# Patient Record
Sex: Female | Born: 1985 | Race: Black or African American | Hispanic: No | Marital: Single | Smoking: Current every day smoker
Health system: Southern US, Community
[De-identification: ages and names within clinical notes are randomized; demographics above are authoritative.]

## PROBLEM LIST (undated history)

## (undated) DIAGNOSIS — I10 Essential (primary) hypertension: Secondary | ICD-10-CM

## (undated) DIAGNOSIS — E7800 Pure hypercholesterolemia, unspecified: Secondary | ICD-10-CM

## (undated) HISTORY — PX: SMALL INTESTINE SURGERY: SHX150

---

## 2007-05-18 ENCOUNTER — Inpatient Hospital Stay (HOSPITAL_COMMUNITY): Admission: AD | Admit: 2007-05-18 | Discharge: 2007-05-18 | Payer: Self-pay | Admitting: Obstetrics and Gynecology

## 2007-05-28 ENCOUNTER — Inpatient Hospital Stay (HOSPITAL_COMMUNITY): Admission: AD | Admit: 2007-05-28 | Discharge: 2007-05-28 | Payer: Self-pay | Admitting: Obstetrics and Gynecology

## 2007-06-20 ENCOUNTER — Inpatient Hospital Stay (HOSPITAL_COMMUNITY): Admission: AD | Admit: 2007-06-20 | Discharge: 2007-06-20 | Payer: Self-pay | Admitting: Obstetrics and Gynecology

## 2007-07-06 ENCOUNTER — Emergency Department (HOSPITAL_COMMUNITY): Admission: EM | Admit: 2007-07-06 | Discharge: 2007-07-06 | Payer: Self-pay | Admitting: Emergency Medicine

## 2007-08-11 ENCOUNTER — Inpatient Hospital Stay (HOSPITAL_COMMUNITY): Admission: RE | Admit: 2007-08-11 | Discharge: 2007-08-13 | Payer: Self-pay | Admitting: Obstetrics and Gynecology

## 2008-01-02 ENCOUNTER — Emergency Department (HOSPITAL_COMMUNITY): Admission: EM | Admit: 2008-01-02 | Discharge: 2008-01-02 | Payer: Self-pay | Admitting: Family Medicine

## 2008-01-04 ENCOUNTER — Emergency Department (HOSPITAL_COMMUNITY): Admission: EM | Admit: 2008-01-04 | Discharge: 2008-01-04 | Payer: Self-pay | Admitting: Emergency Medicine

## 2010-08-04 NOTE — Discharge Summary (Signed)
Sarah Grant, Sarah Grant               ACCOUNT NO.:  [id number]   MEDICAL RECORD NO.:  [id number]          PATIENT TYPE:  INP   LOCATION:  9105                          FACILITY:  [facility]   PHYSICIAN:  Zenaida Niece, M.D.DATE OF BIRTH:  1985/11/11   DATE OF ADMISSION:  08/11/2007  DATE OF DISCHARGE:  08/13/2007                               DISCHARGE SUMMARY   ADMISSION DIAGNOSES:  Intrauterine pregnancy at 40 weeks and possible Rh  sensitization.   PROCEDURES:  On Aug 11, 2007, she had a spontaneous vaginal delivery.   HISTORY AND PHYSICAL:  This is a 25 year old black female gravida 2,  para 0-0-1-0 with an EGA of [redacted] weeks, who presents for elective  induction.  Prenatal care complicated by possible Rh sensitization.  She  had a low titer that reverted to negative by the late third trimester.  She is also size less than dates with SGA, but not growth restriction by  ultrasound.  Prenatal labs blood type is B negative with a possible low-  titer anti-D, RPR nonreactive, rubella immune, hepatitis B surface  antigen negative, HIV negative, and gonorrhea and chlamydia negative,  coag screen is normal.  Group B strep is negative.   PAST OB HISTORY:  One elective termination.   GYN HISTORY:  History of colposcopy in 2007.   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   PHYSICAL EXAMINATION:  She is afebrile with stable vital signs.  Fetal  heart tracing is reactive.  Abdomen gravid, nontender with an estimated  fetal weight of 6 pounds and she has a large transverse scar in the  upper abdomen.  Cervix is 2-3, 90, -1 vertex presentation, adequate  pelvis, and membranes are ruptured revealing clear fluid.   HOSPITAL COURSE:  The patient was admitted and started on Pitocin.  I  was then able to rupture membranes.  She progressed into labor and  received an epidural.  She progressed to complete, pushed well, and on  the evening of Aug 11, 2007, had a  vaginal delivery of a viable female  infant with Apgars of 8 and 9, weighted 6 pounds 14 ounces.  Placenta  delivered spontaneously was intact and was sent for cord blood  collection.  She had a right labial laceration repaired with 3-0 Vicryl.  Estimated blood loss was less than 500 mL.  Postpartum, she had no  significant complications.  Pre delivery hemoglobin was 11, postdelivery  is 10.5.  The baby was Rh negative, so she did not were require a workup  for RhoGAM.  On postpartum #2, she was felt to be stable enough for  discharge home.   DISCHARGE INSTRUCTIONS:  Regular diet, pelvic rest.  Follow-up is in 6  weeks.  Medications are Percocet #20 1-2 p.o. q.4-6 h. p.r.n. pain and  over-the-counter ibuprofen as needed and she is given our discharge  pamphlet.     Zenaida Niece, M.D.  Electronically Signed    TDM/MEDQ  D:  08/13/2007  T:  08/13/2007  Job:  [id number]

## 2010-12-11 LAB — RH IMMUNE GLOBULIN WORKUP (NOT WOMEN'S HOSP): DAT, IgG: NEGATIVE

## 2010-12-11 LAB — GLUCOSE TOLERANCE, 1 HOUR: Glucose, 1 Hour GTT: 81

## 2010-12-14 LAB — COMPREHENSIVE METABOLIC PANEL
ALT: 13
AST: 21
Albumin: 2.7 — ABNORMAL LOW
Alkaline Phosphatase: 70
Chloride: 103
GFR calc Af Amer: >60
Potassium: 3.7
Sodium: 133 — ABNORMAL LOW
Total Bilirubin: 0.6

## 2010-12-14 LAB — URINALYSIS, ROUTINE W REFLEX MICROSCOPIC
Bilirubin Urine: NEGATIVE
Hgb urine dipstick: NEGATIVE
Protein, ur: NEGATIVE
Urobilinogen, UA: 0.2

## 2010-12-14 LAB — FETAL FIBRONECTIN: Fetal Fibronectin: NEGATIVE

## 2010-12-14 LAB — CBC
HCT: 33.3 — ABNORMAL LOW
Platelets: 299
WBC: 15.6 — ABNORMAL HIGH

## 2010-12-16 LAB — CBC
Hemoglobin: 10.5 — ABNORMAL LOW
Hemoglobin: 11 — ABNORMAL LOW
MCHC: 34
MCHC: 34.7
MCV: 93.1
RBC: 3.49 — ABNORMAL LOW
RDW: 13.7
WBC: 17 — ABNORMAL HIGH
WBC: 7.2

## 2010-12-22 LAB — POCT URINALYSIS DIP (DEVICE)
Glucose, UA: NEGATIVE
Ketones, ur: NEGATIVE
Operator id: 29721
Protein, ur: >=300 — AB
Urobilinogen, UA: 0.2

## 2010-12-22 LAB — POCT PREGNANCY, URINE: Preg Test, Ur: NEGATIVE

## 2010-12-22 LAB — URINE CULTURE: Colony Count: 8000

## 2010-12-22 LAB — URINE MICROSCOPIC-ADD ON

## 2010-12-22 LAB — URINALYSIS, ROUTINE W REFLEX MICROSCOPIC
Glucose, UA: NEGATIVE
Hgb urine dipstick: NEGATIVE
Ketones, ur: 15 — AB
Nitrite: NEGATIVE
Protein, ur: 100 — AB
Specific Gravity, Urine: 1.036 — ABNORMAL HIGH
Urobilinogen, UA: 1
pH: 6.5

## 2010-12-26 ENCOUNTER — Emergency Department (HOSPITAL_COMMUNITY)
Admission: EM | Admit: 2010-12-26 | Discharge: 2010-12-27 | Disposition: A | Discharge status: Home / Self Care | Payer: BC Managed Care – PPO | Attending: Emergency Medicine | Admitting: Emergency Medicine

## 2010-12-26 DIAGNOSIS — J029 Acute pharyngitis, unspecified: Secondary | ICD-10-CM | POA: Insufficient documentation

## 2010-12-27 LAB — RAPID STREP SCREEN (MED CTR MEBANE ONLY): Streptococcus, Group A Screen (Direct): NEGATIVE

## 2011-10-11 ENCOUNTER — Ambulatory Visit (INDEPENDENT_AMBULATORY_CARE_PROVIDER_SITE_OTHER): Payer: BC Managed Care – PPO | Admitting: Family Medicine

## 2011-10-11 VITALS — BP 116/68 | HR 88 | Temp 97.9°F | Resp 20 | Ht 66.5 in | Wt 149.0 lb

## 2011-10-11 DIAGNOSIS — J029 Acute pharyngitis, unspecified: Secondary | ICD-10-CM

## 2011-10-11 DIAGNOSIS — N946 Dysmenorrhea, unspecified: Secondary | ICD-10-CM

## 2011-10-11 DIAGNOSIS — N912 Amenorrhea, unspecified: Secondary | ICD-10-CM

## 2011-10-11 DIAGNOSIS — J039 Acute tonsillitis, unspecified: Secondary | ICD-10-CM

## 2011-10-11 LAB — POCT CBC
Granulocyte percent: 83.2 %G — AB (ref 37–80)
HCT, POC: 43.4 % (ref 37.7–47.9)
Hemoglobin: 13.5 g/dL (ref 12.2–16.2)
POC Granulocyte: 11.4 — AB (ref 2–6.9)
RBC: 4.37 M/uL (ref 4.04–5.48)
RDW, POC: 14.1 %

## 2011-10-11 LAB — POCT URINE PREGNANCY: Preg Test, Ur: NEGATIVE

## 2011-10-11 MED ORDER — CEFTRIAXONE SODIUM 1 G IJ SOLR
1.0000 g | INTRAMUSCULAR | Status: DC
  Administered 2011-10-11: 1 g via INTRAMUSCULAR

## 2011-10-11 MED ORDER — AZITHROMYCIN 250 MG PO TABS: ORAL_TABLET | ORAL | Status: AC

## 2011-10-11 NOTE — Patient Instructions (Signed)
Return if at all worse.  Pharyngitis, Viral and Bacterial Pharyngitis is soreness (inflammation) or infection of the pharynx. It is also called a sore throat. CAUSES  Most sore throats are caused by viruses and are part of a cold. However, some sore throats are caused by strep and other bacteria. Sore throats can also be caused by post nasal drip from draining sinuses, allergies and sometimes from sleeping with an open mouth. Infectious sore throats can be spread from person to person by coughing, sneezing and sharing cups or eating utensils. TREATMENT  Sore throats that are viral usually last 3-4 days. Viral illness will get better without medications (antibiotics). Strep throat and other bacterial infections will usually begin to get better about 24-48 hours after you begin to take antibiotics. HOME CARE INSTRUCTIONS   If the caregiver feels there is a bacterial infection or if there is a positive strep test, they will prescribe an antibiotic. The full course of antibiotics must be taken. If the full course of antibiotic is not taken, you or your child may become ill again. If you or your child has strep throat and do not finish all of the medication, serious heart or kidney diseases may develop.   Drink enough water and fluids to keep your urine clear or pale yellow.   Only take over-the-counter or prescription medicines for pain, discomfort or fever as directed by your caregiver.   Get lots of rest.   Gargle with salt water ( tsp. of salt in a glass of water) as often as every 1-2 hours as you need for comfort.   Hard candies may soothe the throat if individual is not at risk for choking. Throat sprays or lozenges may also be used.  SEEK MEDICAL CARE IF:   Large, tender lumps in the neck develop.   A rash develops.   Green, yellow-brown or bloody sputum is coughed up.   Your baby is older than 3 months with a rectal temperature of 100.5 F (38.1 C) or higher for more than 1 day.    SEEK IMMEDIATE MEDICAL CARE IF:   A stiff neck develops.   You or your child are drooling or unable to swallow liquids.   You or your child are vomiting, unable to keep medications or liquids down.   You or your child has severe pain, unrelieved with recommended medications.   You or your child are having difficulty breathing (not due to stuffy nose).   You or your child are unable to fully open your mouth.   You or your child develop redness, swelling, or severe pain anywhere on the neck.   You have a fever.   Your baby is older than 3 months with a rectal temperature of 102 F (38.9 C) or higher.   Your baby is 59 months old or younger with a rectal temperature of 100.4 F (38 C) or higher.  MAKE SURE YOU:   Understand these instructions.   Will watch your condition.   Will get help right away if you are not doing well or get worse.  Document Released: 03/08/2005 Document Revised: 02/25/2011 Document Reviewed: 06/05/2007 [facility] Patient Information 2012 [place], [place].

## 2011-10-11 NOTE — Progress Notes (Signed)
Subjective: Patient has been having a sore throat since last night. She is chilled. She has a week or more late on her menstrual cycle at home brings test which is negative. She does not have running nose or cough.Has had oral sex.  Objective:  TMs normal. Throat erythematous with a lot of possible left tonsil. Neck supple without nodes on the right but has a moderate nodes submandibular on the left. Chest clear. Heart regular without murmurs.  Assessment: Pharyngitis Amenorrhea  Plan: Pregnancy test Strep screen Throat culture if strep screen positive CBC  Results for orders placed in visit on 10/11/11  POCT URINE PREGNANCY      Component Value Range   Preg Test, Ur Negative    POCT RAPID STREP A (OFFICE)      Component Value Range   Rapid Strep A Screen Negative  Negative  POCT CBC      Component Value Range   WBC 13.7 (*) 4.6 - 10.2 K/uL   Lymph, poc 1.6  0.6 - 3.4   POC LYMPH PERCENT 11.7  10 - 50 %L   MID (cbc) 0.7  0 - 0.9   POC MID % 5.1  0 - 12 %M   POC Granulocyte 11.4 (*) 2 - 6.9   Granulocyte percent 83.2 (*) 37 - 80 %G   RBC 4.37  4.04 - 5.48 M/uL   Hemoglobin 13.5  12.2 - 16.2 g/dL   HCT, POC 16.1  09.6 - 47.9 %   MCV 99.2 (*) 80 - 97 fL   MCH, POC 30.9  27 - 31.2 pg   MCHC 31.1 (*) 31.8 - 35.4 g/dL   RDW, POC 04.5     Platelet Count, POC 288  142 - 424 K/uL   MPV 9.2  0 - 99.8 fL   With the strep screen being negative cornea still treating her since her white count is high. We'll go in and give a shot of Rocephin and check a Epstein-Barr titer

## 2011-10-13 LAB — CULTURE, GROUP A STREP: Organism ID, Bacteria: NORMAL

## 2012-08-10 ENCOUNTER — Encounter (HOSPITAL_BASED_OUTPATIENT_CLINIC_OR_DEPARTMENT_OTHER): Payer: Self-pay | Admitting: Emergency Medicine

## 2012-08-10 ENCOUNTER — Emergency Department (HOSPITAL_BASED_OUTPATIENT_CLINIC_OR_DEPARTMENT_OTHER)
Admission: EM | Admit: 2012-08-10 | Discharge: 2012-08-10 | Disposition: A | Discharge status: Home / Self Care | Payer: Self-pay | Attending: Emergency Medicine | Admitting: Emergency Medicine

## 2012-08-10 DIAGNOSIS — F17200 Nicotine dependence, unspecified, uncomplicated: Secondary | ICD-10-CM | POA: Insufficient documentation

## 2012-08-10 DIAGNOSIS — H109 Unspecified conjunctivitis: Secondary | ICD-10-CM | POA: Insufficient documentation

## 2012-08-10 DIAGNOSIS — H538 Other visual disturbances: Secondary | ICD-10-CM | POA: Insufficient documentation

## 2012-08-10 DIAGNOSIS — H53149 Visual discomfort, unspecified: Secondary | ICD-10-CM | POA: Insufficient documentation

## 2012-08-10 MED ORDER — ERYTHROMYCIN 5 MG/GM OP OINT
TOPICAL_OINTMENT | Freq: Four times a day (QID) | OPHTHALMIC | Status: DC
  Administered 2012-08-10: 1 via OPHTHALMIC
  Filled 2012-08-10: qty 3.5

## 2012-08-10 MED ORDER — FLUORESCEIN SODIUM 1 MG OP STRP
1.0000 | ORAL_STRIP | Freq: Once | OPHTHALMIC | Status: AC
  Administered 2012-08-10: 1 via OPHTHALMIC

## 2012-08-10 MED ORDER — FLUORESCEIN SODIUM 1 MG OP STRP
ORAL_STRIP | OPHTHALMIC | Status: AC
  Administered 2012-08-10: 1 via OPHTHALMIC
  Filled 2012-08-10: qty 1

## 2012-08-10 MED ORDER — TETRACAINE HCL 0.5 % OP SOLN
2.0000 [drp] | Freq: Once | OPHTHALMIC | Status: AC
  Administered 2012-08-10: 2 [drp] via OPHTHALMIC

## 2012-08-10 MED ORDER — TETRACAINE HCL 0.5 % OP SOLN
OPHTHALMIC | Status: AC
  Administered 2012-08-10: 2 [drp] via OPHTHALMIC
  Filled 2012-08-10: qty 2

## 2012-08-10 MED ORDER — HYDROCODONE-ACETAMINOPHEN 5-325 MG PO TABS
1.0000 | ORAL_TABLET | Freq: Once | ORAL | Status: AC
  Administered 2012-08-10: 1 via ORAL
  Filled 2012-08-10: qty 1

## 2012-08-10 MED ORDER — HYDROCODONE-ACETAMINOPHEN 5-325 MG PO TABS: 1.0000 | ORAL_TABLET | Freq: Four times a day (QID) | ORAL | Status: DC | PRN

## 2012-08-10 NOTE — ED Notes (Signed)
Pt c/o redness, pain and light sensitivity to left eye.

## 2012-08-10 NOTE — ED Provider Notes (Signed)
History     CSN: [id number]  Arrival date & time 08/10/12  4098   First MD Initiated Contact with Patient 08/10/12 0541      Chief Complaint  Patient presents with  . Eye Pain    (Consider location/radiation/quality/duration/timing/severity/associated sxs/prior treatment) HPI This is a 27 year old female who complains of pain in her left eye since yesterday afternoon at work. The symptoms are moderate but persistent. It is associated with photophobia, redness and tearing. There is slight blurriness. She is not aware of any trauma.  History reviewed. No pertinent past medical history.  Past Surgical History  Procedure Laterality Date  . Small intestine surgery      No family history on file.  History  Substance Use Topics  . Smoking status: Current Every Day Smoker -- 0.20 packs/day    Types: Cigarettes  . Smokeless tobacco: Not on file     Comment: Black and Mild  . Alcohol Use: No    OB History   Grav Para Term Preterm Abortions TAB SAB Ect Mult Living                  Review of Systems  All other systems reviewed and are negative.    Allergies  Review of patient's allergies indicates no known allergies.  Home Medications  No current outpatient prescriptions on file.  BP 128/77  Pulse 71  Temp(Src) 98.5 F (36.9 C) (Oral)  Resp 18  SpO2 98%  Physical Exam General: Well-developed, well-nourished female in no acute distress; appearance consistent with age of record HENT: normocephalic, atraumatic Eyes: pupils equal round and reactive to light; extraocular muscles intact; left conjunctival injection, photophobia and tearing without hyphema or fluorescein uptake by the cornea; globes soft and nontender Neck: supple Heart: regular rate and rhythm Lungs: Normal respiratory effort and excursion Abdomen: soft; nondistended Extremities: No deformity; full range of motion Neurologic: Awake, alert and oriented; motor function intact in all extremities and  symmetric; no facial droop Skin: Warm and dry Psychiatric: Normal mood and affect    ED Course  Procedures (including critical care time)    MDM  Conjunctivitis versus superficial injury.        Hanley Seamen, MD 08/10/12 [phone]

## 2012-10-02 ENCOUNTER — Emergency Department (HOSPITAL_BASED_OUTPATIENT_CLINIC_OR_DEPARTMENT_OTHER)
Admission: EM | Admit: 2012-10-02 | Discharge: 2012-10-02 | Disposition: A | Discharge status: Home / Self Care | Payer: Self-pay | Attending: Emergency Medicine | Admitting: Emergency Medicine

## 2012-10-02 ENCOUNTER — Encounter (HOSPITAL_BASED_OUTPATIENT_CLINIC_OR_DEPARTMENT_OTHER): Payer: Self-pay | Admitting: Emergency Medicine

## 2012-10-02 DIAGNOSIS — Z3202 Encounter for pregnancy test, result negative: Secondary | ICD-10-CM | POA: Insufficient documentation

## 2012-10-02 DIAGNOSIS — N898 Other specified noninflammatory disorders of vagina: Secondary | ICD-10-CM | POA: Insufficient documentation

## 2012-10-02 DIAGNOSIS — F17200 Nicotine dependence, unspecified, uncomplicated: Secondary | ICD-10-CM | POA: Insufficient documentation

## 2012-10-02 DIAGNOSIS — Z202 Contact with and (suspected) exposure to infections with a predominantly sexual mode of transmission: Secondary | ICD-10-CM | POA: Insufficient documentation

## 2012-10-02 LAB — URINE MICROSCOPIC-ADD ON

## 2012-10-02 LAB — URINALYSIS, ROUTINE W REFLEX MICROSCOPIC
Glucose, UA: NEGATIVE mg/dL
Leukocytes, UA: NEGATIVE
Nitrite: NEGATIVE
Protein, ur: 100 mg/dL — AB
pH: 7 (ref 5.0–8.0)

## 2012-10-02 LAB — PREGNANCY, URINE: Preg Test, Ur: NEGATIVE

## 2012-10-02 MED ORDER — AZITHROMYCIN 250 MG PO TABS
1000.0000 mg | ORAL_TABLET | Freq: Once | ORAL | Status: AC
  Administered 2012-10-02: 1000 mg via ORAL
  Filled 2012-10-02: qty 4

## 2012-10-02 MED ORDER — CEFTRIAXONE SODIUM 250 MG IJ SOLR
250.0000 mg | Freq: Once | INTRAMUSCULAR | Status: AC
  Administered 2012-10-02: 250 mg via INTRAMUSCULAR
  Filled 2012-10-02: qty 250

## 2012-10-02 NOTE — ED Notes (Signed)
Pt states boyfriend was dx with unknown STD. Pt reports vaginal discharge.

## 2012-10-02 NOTE — ED Provider Notes (Signed)
History     This chart was scribed for Gwyneth Sprout, MD by Jiles Prows, ED Scribe. The patient was seen in room MH09/MH09 and the patient's care was started at 9:22 PM.  CSN: [id number] Arrival date & time 10/02/12  2044   Chief Complaint  Patient presents with  . Exposure to STD   Patient is a 27 y.o. female presenting with STD exposure. The history is provided by the patient and medical records. No language interpreter was used.  Exposure to STD This is a new problem. The current episode started 2 days ago. The problem occurs constantly. The problem has not changed since onset.Pertinent negatives include no abdominal pain. Nothing aggravates the symptoms. Nothing relieves the symptoms. She has tried nothing for the symptoms.   HPI Comments: Sarah Grant is a 27 y.o. female who presents to the Emergency Department complaining of vaginal discharge onset 2 days ago.  Pt reports that her boyfriend was treated for a STD with pills and a shot.  She came in for treatment for the same STD.  Pt denies headache, diaphoresis, fever, chills, nausea, vomiting, diarrhea, weakness, cough, SOB and any other pain. LMP 25th of June.  History reviewed. No pertinent past medical history. Past Surgical History  Procedure Laterality Date  . Small intestine surgery     No family history on file. History  Substance Use Topics  . Smoking status: Current Every Day Smoker -- 0.20 packs/day    Types: Cigarettes  . Smokeless tobacco: Not on file     Comment: Black and Mild  . Alcohol Use: No   OB History   Grav Para Term Preterm Abortions TAB SAB Ect Mult Living                 Review of Systems  Gastrointestinal: Negative for abdominal pain.  Genitourinary: Positive for vaginal discharge.    Allergies  Review of patient's allergies indicates no known allergies.  Home Medications   Current Outpatient Rx  Name  Route  Sig  Dispense  Refill  . HYDROcodone-acetaminophen (NORCO/VICODIN) 5-325  MG per tablet   Oral   Take 1-2 tablets by mouth every 6 (six) hours as needed for pain.   10 tablet   0    BP 119/72  Pulse 72  Temp(Src) 99.9 F (37.7 C)  Resp 18  Ht 5\' 6"  (1.676 m)  Wt 140 lb (63.504 kg)  BMI 22.61 kg/m2  SpO2 100% Physical Exam  Nursing note and vitals reviewed. Constitutional: She is oriented to person, place, and time. She appears well-developed and well-nourished. No distress.  HENT:  Head: Normocephalic and atraumatic.  Eyes: EOM are normal.  Neck: Neck supple. No tracheal deviation present.  Cardiovascular: Normal rate, regular rhythm, normal heart sounds and intact distal pulses.  Exam reveals no gallop and no friction rub.   No murmur heard. Pulmonary/Chest: Effort normal and breath sounds normal. No respiratory distress. She has no wheezes. She has no rales. She exhibits no tenderness.  Abdominal: There is no tenderness.  Genitourinary: Uterus normal. Cervix exhibits no motion tenderness and no friability. Right adnexum displays no mass, no tenderness and no fullness. Left adnexum displays no mass, no tenderness and no fullness. No tenderness around the vagina. Vaginal discharge found.  Musculoskeletal: Normal range of motion.  Neurological: She is alert and oriented to person, place, and time.  Skin: Skin is warm and dry.  Psychiatric: She has a normal mood and affect. Her behavior is normal.  ED Course  Procedures (including critical care time) DIAGNOSTIC STUDIES: Filed Vitals:   10/02/12 2054  BP: 119/72  Pulse: 72  Temp: 99.9 F (37.7 C)  Resp: 18  Height: 5\' 6"  (1.676 m)  Weight: 140 lb (63.504 kg)  SpO2: 100%   COORDINATION OF CARE: 9:25 PM - Discussed ED treatment with pt at bedside including pelvic exam and STD treatment and pt agrees.  9:26 PM - Pelvic exam, chaperone present.  Pelvic shows vaginal discharge.  No bleeding.  No cervical motion tenderness.  No adnexal tenderness.  Labs Reviewed  WET PREP, GENITAL - Abnormal;  Notable for the following:    Clue Cells Wet Prep HPF POC MODERATE (*)    WBC, Wet Prep HPF POC MODERATE (*)    All other components within normal limits  URINALYSIS, ROUTINE W REFLEX MICROSCOPIC - Abnormal; Notable for the following:    Protein, ur 100 (*)    All other components within normal limits  GC/CHLAMYDIA PROBE AMP  PREGNANCY, URINE  URINE MICROSCOPIC-ADD ON   No results found. 1. Exposure to STD     MDM   Patient with STD exposure from her boyfriend who was recently treated several days ago. The last few days she's noted thickening of vaginal discharge but otherwise has no other complaints. Well appearing on exam with normal vital signs except for mild fever of 99.9. Pelvic with vaginal discharge but no cervical motion tenderness. Wet prep showed moderate white blood cells moderate clue cells. Patient treated with Rocephin and azithromycin.  I personally performed the services described in this documentation, which was scribed in my presence.  The recorded information has been reviewed and considered.    Gwyneth Sprout, MD 10/02/12 2225

## 2012-10-03 LAB — GC/CHLAMYDIA PROBE AMP: GC Probe RNA: NEGATIVE

## 2013-07-24 ENCOUNTER — Encounter (HOSPITAL_BASED_OUTPATIENT_CLINIC_OR_DEPARTMENT_OTHER): Payer: Self-pay | Admitting: Emergency Medicine

## 2013-07-24 ENCOUNTER — Emergency Department (HOSPITAL_BASED_OUTPATIENT_CLINIC_OR_DEPARTMENT_OTHER)
Admission: EM | Admit: 2013-07-24 | Discharge: 2013-07-24 | Disposition: A | Discharge status: Home / Self Care | Payer: Managed Care, Other (non HMO) | Attending: Emergency Medicine | Admitting: Emergency Medicine

## 2013-07-24 DIAGNOSIS — R109 Unspecified abdominal pain: Secondary | ICD-10-CM | POA: Insufficient documentation

## 2013-07-24 DIAGNOSIS — Z3202 Encounter for pregnancy test, result negative: Secondary | ICD-10-CM | POA: Insufficient documentation

## 2013-07-24 DIAGNOSIS — F17200 Nicotine dependence, unspecified, uncomplicated: Secondary | ICD-10-CM | POA: Insufficient documentation

## 2013-07-24 DIAGNOSIS — R112 Nausea with vomiting, unspecified: Secondary | ICD-10-CM | POA: Insufficient documentation

## 2013-07-24 DIAGNOSIS — Z9889 Other specified postprocedural states: Secondary | ICD-10-CM | POA: Insufficient documentation

## 2013-07-24 LAB — COMPREHENSIVE METABOLIC PANEL
ALT: 36 U/L — AB (ref 0–35)
AST: 36 U/L (ref 0–37)
Albumin: 4 g/dL (ref 3.5–5.2)
Alkaline Phosphatase: 52 U/L (ref 39–117)
BILIRUBIN TOTAL: 0.6 mg/dL (ref 0.3–1.2)
BUN: 10 mg/dL (ref 6–23)
CHLORIDE: 101 meq/L (ref 96–112)
CO2: 26 meq/L (ref 19–32)
Calcium: 9.5 mg/dL (ref 8.4–10.5)
Creatinine, Ser: 0.8 mg/dL (ref 0.50–1.10)
GFR calc Af Amer: >90 mL/min (ref >90)
Glucose, Bld: 90 mg/dL (ref 70–99)
Potassium: 4.2 mEq/L (ref 3.7–5.3)
SODIUM: 139 meq/L (ref 137–147)
Total Protein: 7.8 g/dL (ref 6.0–8.3)

## 2013-07-24 LAB — CBC WITH DIFFERENTIAL/PLATELET
BASOS ABS: 0 10*3/uL (ref 0.0–0.1)
Basophils Relative: 0 % (ref 0–1)
Eosinophils Absolute: 0.1 10*3/uL (ref 0.0–0.7)
Eosinophils Relative: 1 % (ref 0–5)
HEMATOCRIT: 40.4 % (ref 36.0–46.0)
Hemoglobin: 13.9 g/dL (ref 12.0–15.0)
LYMPHS PCT: 11 % — AB (ref 12–46)
Lymphs Abs: 1 10*3/uL (ref 0.7–4.0)
MCH: 33 pg (ref 26.0–34.0)
MCHC: 34.4 g/dL (ref 30.0–36.0)
MCV: 96 fL (ref 78.0–100.0)
MONOS PCT: 5 % (ref 3–12)
Monocytes Absolute: 0.5 10*3/uL (ref 0.1–1.0)
NEUTROS ABS: 7.9 10*3/uL — AB (ref 1.7–7.7)
Neutrophils Relative %: 83 % — ABNORMAL HIGH (ref 43–77)
PLATELETS: 232 10*3/uL (ref 150–400)
RBC: 4.21 MIL/uL (ref 3.87–5.11)
RDW: 12.7 % (ref 11.5–15.5)
WBC: 9.5 10*3/uL (ref 4.0–10.5)

## 2013-07-24 LAB — URINALYSIS, ROUTINE W REFLEX MICROSCOPIC
Bilirubin Urine: NEGATIVE
GLUCOSE, UA: NEGATIVE mg/dL
HGB URINE DIPSTICK: NEGATIVE
KETONES UR: NEGATIVE mg/dL
Nitrite: NEGATIVE
PROTEIN: 30 mg/dL — AB
Specific Gravity, Urine: 1.025 (ref 1.005–1.030)
Urobilinogen, UA: 1 mg/dL (ref 0.0–1.0)
pH: 7.5 (ref 5.0–8.0)

## 2013-07-24 LAB — URINE MICROSCOPIC-ADD ON

## 2013-07-24 LAB — LIPASE, BLOOD: Lipase: 31 U/L (ref 11–59)

## 2013-07-24 LAB — PREGNANCY, URINE: PREG TEST UR: NEGATIVE

## 2013-07-24 MED ORDER — SODIUM CHLORIDE 0.9 % IV BOLUS (SEPSIS)
1000.0000 mL | Freq: Once | INTRAVENOUS | Status: AC
  Administered 2013-07-24: 1000 mL via INTRAVENOUS

## 2013-07-24 MED ORDER — ONDANSETRON HCL 4 MG/2ML IJ SOLN
4.0000 mg | Freq: Once | INTRAMUSCULAR | Status: AC
  Administered 2013-07-24: 4 mg via INTRAVENOUS
  Filled 2013-07-24: qty 2

## 2013-07-24 MED ORDER — ONDANSETRON 4 MG PO TBDP: 4.0000 mg | ORAL_TABLET | Freq: Three times a day (TID) | ORAL | Status: DC | PRN

## 2013-07-24 NOTE — ED Notes (Signed)
Pt c/o vomiting x 6 hrs

## 2013-07-24 NOTE — ED Provider Notes (Signed)
CSN: [id number]     Arrival date & time 07/24/13  [id number] History   This chart was scribed for Sarah Grant Markasia Carrol, MD by Bronson CurbJacqueline Melvin, ED Scribe. This patient was seen in room MH12/MH12 and the patient's care was started at 8:02 PM.     Chief Complaint  Patient presents with  . Emesis    The history is provided by the patient. No language interpreter was used.   HPI Comments: Sarah Grant is a 28 y.o. female who presents to the Emergency Department complaining of intermittent emesis that began 8 hours ago. Patient reports she was at work when she suddenly felt nauseous. There is associated cold sweats, nausea, and abdominal pain. She reports she has been unable to tolerate food, and has taken Pepto-Bismol with little to no relief. She denies diarrhea, difficulty breathing, chest pain, rash, or swelling. She reports history of small intestine surgery.   History reviewed. No pertinent past medical history. Past Surgical History  Procedure Laterality Date  . Small intestine surgery     History reviewed. No pertinent family history. History  Substance Use Topics  . Smoking status: Current Every Day Smoker -- 0.20 packs/day    Types: Cigarettes  . Smokeless tobacco: Not on file     Comment: Black and Mild  . Alcohol Use: No   OB History   Grav Para Term Preterm Abortions TAB SAB Ect Mult Living                 Review of Systems  Constitutional:       Per HPI, otherwise negative  HENT:       Per HPI, otherwise negative  Respiratory:       Per HPI, otherwise negative  Cardiovascular:       Per HPI, otherwise negative  Gastrointestinal: Positive for vomiting.  Endocrine:       Negative aside from HPI  Genitourinary:       Neg aside from HPI   Musculoskeletal:       Per HPI, otherwise negative  Skin: Negative.   Neurological: Negative for syncope.      Allergies  Review of patient's allergies indicates no known allergies.  Home Medications   Prior to Admission  medications   Medication Sig Start Date End Date Taking? Authorizing Provider  HYDROcodone-acetaminophen (NORCO/VICODIN) 5-325 MG per tablet Take 1-2 tablets by mouth every 6 (six) hours as needed for pain. 08/10/12   Carlisle BeersJohn L Molpus, MD   Triage Vitals: BP 124/64  Pulse 80  Temp(Src) 100 F (37.8 C) (Oral)  Resp 16  Ht 5\' 7"  (1.702 m)  Wt 140 lb (63.504 kg)  BMI 21.92 kg/m2  SpO2 100%  LMP 07/09/2013  Physical Exam  Nursing note and vitals reviewed. Constitutional: She is oriented to person, place, and time. She appears well-developed and well-nourished. No distress.  HENT:  Head: Normocephalic and atraumatic.  Eyes: Conjunctivae and EOM are normal.  Cardiovascular: Normal rate, regular rhythm and normal heart sounds.   Pulmonary/Chest: Effort normal and breath sounds normal. No stridor. No respiratory distress.  Abdominal: Soft. She exhibits no distension. There is no tenderness.  Musculoskeletal: She exhibits no edema.  Neurological: She is alert and oriented to person, place, and time. No cranial nerve deficit.  Skin: Skin is warm and dry.  Psychiatric: She has a normal mood and affect.    ED Course  Procedures (including critical care time)  DIAGNOSTIC STUDIES: Oxygen Saturation is 100% on room air, normal by  my interpretation.    COORDINATION OF CARE: At 8:07 Discussed treatment plan with patient which includes labs and IV fluids. Patient agrees.   Labs Review Labs Reviewed  CBC WITH DIFFERENTIAL - Abnormal; Notable for the following:    Neutrophils Relative % 83 (*)    Lymphocytes Relative 11 (*)    Neutro Abs 7.9 (*)    All other components within normal limits  COMPREHENSIVE METABOLIC PANEL - Abnormal; Notable for the following:    ALT 36 (*)    All other components within normal limits  URINALYSIS, ROUTINE W REFLEX MICROSCOPIC - Abnormal; Notable for the following:    Protein, ur 30 (*)    Leukocytes, UA TRACE (*)    All other components within normal limits   URINE MICROSCOPIC-ADD ON - Abnormal; Notable for the following:    Squamous Epithelial / LPF FEW (*)    All other components within normal limits  LIPASE, BLOOD  PREGNANCY, URINE    9:23 PM On repeat exam the patient appears comfortable, states that she feels entirely better, has no abdominal pain.  When we discussed current precautions, follow up instructions.   MDM    I personally performed the services described in this documentation, which was scribed in my presence. The recorded information has been reviewed and is accurate.   This generally well young female presents with several hours of nausea, vomiting. Patient has no focal abdominal pain, is in no distress, and has resolution of symptoms while here. Patient's evaluation is largely reassuring, she is discharged with antiemetics, primary care followup  Sarah Grant Adalai Perl, MD 07/24/13 2123

## 2013-07-24 NOTE — Discharge Instructions (Signed)
As discussed, your evaluation today has been largely reassuring.  But, it is important that you monitor your condition carefully, and do not hesitate to return to the ED if you develop new, or concerning changes in your condition. ° °Otherwise, please follow-up with your physician for appropriate ongoing care. ° ° °  Emergency Department Resource Guide °1) Find a Doctor and Pay Out of Pocket °Although you won't have to find out who is covered by your insurance plan, it is a good idea to ask around and get recommendations. You will then need to call the office and see if the doctor you have chosen will accept you as a new patient and what types of options they offer for patients who are self-pay. Some doctors offer discounts or will set up payment plans for their patients who do not have insurance, but you will need to ask so you aren't surprised when you get to your appointment. ° °2) Contact Your Local Health Department °Not all health departments have doctors that can see patients for sick visits, but many do, so it is worth a call to see if yours does. If you don't know where your local health department is, you can check in your phone book. The CDC also has a tool to help you locate your state's health department, and many state websites also have listings of all of their local health departments. ° °3) Find a Walk-in Clinic °If your illness is not likely to be very severe or complicated, you may want to try a walk in clinic. These are popping up all over the country in pharmacies, drugstores, and shopping centers. They're usually staffed by nurse practitioners or physician assistants that have been trained to treat common illnesses and complaints. They're usually fairly quick and inexpensive. However, if you have serious medical issues or chronic medical problems, these are probably not your best option. ° °No Primary Care Doctor: °- Call [facility] at  832-8000 - they can help you locate a primary care  doctor that  accepts your insurance, provides certain services, etc. °- Physician Referral Service- 1-800-533-3463 ° °Chronic Pain Problems: °Organization         Address  Phone   Notes  °[facility]  (336) 297-2271 Patients need to be referred by their primary care doctor.  ° °Medication Assistance: °Organization         Address  Phone   Notes  °[place] [facility] [address] °[place], [place] [address] (336) 641-8030 --Must be a resident of [place] °-- Must have NO insurance coverage whatsoever (no Medicaid/ Medicare, etc.) °-- The pt. MUST have a primary care doctor that directs their care regularly and follows them in the community °  °[facility]  (866) 331-1348   °[employer]  (888) 892-1162   ° °Agencies that provide inexpensive medical care: °Organization         Address  Phone   Notes  °[facility]  (336) 832-8035   °[facility]    (336) 832-7272   °[facility] [address] °[place], [place] [address] (336) 832-4777   °[facility] [address], °[place] (336) 271-4999   °[facility]    (336) 373-0678   °[facility]    (336) 272-1050   °[facility] ° [address],  Phone:  (336) 832-4444, Fax:  (336) 832-4440 Hours of Operation:    9 am - 6 pm, M-F.  Also accepts Medicaid/Medicare and self-pay.  °[facility] ° [address], [place] Phone: (336) 832-3150, Fax: (336) 832-3151. Hours of Operation:  8:30 am - 5:30 pm, M-F.  Also accepts Medicaid and self-pay.  °[facility] [place] [address], [place] Phone: (336) 878-6027   °[facility] [address], [place], [place] (336)723-1848, Ext. 123 Mondays & Thursdays: 7-9 AM.  First 15 patients are seen on a first come, first serve basis. °  ° °Medicaid-accepting [place]  Providers: ° °Organization         Address  Phone   Notes  °[facility] [address], [place] (336) 641-2100 Also accepts self-pay patients.  °[facility] [address], [place] ° (336) 856-9996   °[facility] [address], [place] (336) 288-8857   °[facility] [address] [place] [address], [place] (336) 299-7000   °Veita Bland [address], [place]  ° (336) 373-1557 Only accepts [place] Access Medicaid patients after they have their name applied to their card.  ° °Self-Pay (no insurance) in [place]: ° °Organization         Address  Phone   Notes  °Sickle Cell Patients, [facility] [address], [place] (336) 832-1970   °[facility] [address], [place] (336) 832-4400   °[facility] ° [address], [place] (336) 992-4800   °[facility]/Dr. Osei-Bonsu ° [address] [place] [address], [place] or [address], [place] (336) 841-8500 Phone number for both [place] and [place] locations is the same.  °[facility] [address], [place] (336) 299-0000   °[facility] [address] [place] [address], [place] or [address] (336) 852-7530 °(336) 878-2260   °[facility] [address], [place] (336) 350-1642, phone; (336) 294-5005, fax Sees patients 1st and 3rd Saturday of every month.  Must not qualify for public or private insurance (i.e. Medicaid, Medicare, [place] Health Choice, Veterans' Benefits) • Household income should be no more than 200% of the poverty level •The clinic cannot treat you if you are pregnant or think you are pregnant • Sexually transmitted diseases are not treated at the clinic.  ° °Dental Care: °Organization         Address  Phone  Notes  °[facility] [address], [place] (336) 641-6152 Accepts children up to age 21 who are enrolled in Medicaid or [place] Health Choice; pregnant women with a Medicaid card; and children who have applied for Medicaid or [place] Health Choice, but were declined, whose parents can pay a reduced fee at time of service.  °[facility] [place]  [address], [place] (336) 641-7733 Accepts children up to age 21 who are enrolled in Medicaid or [place] Health Choice; pregnant women with a Medicaid card; and children who have applied for Medicaid or [place] Health Choice, but were declined, whose parents can pay a reduced fee at time of service.  °[facility] ° [address], [place] (336) 641-4533 Patients are seen by appointment only. Walk-ins are not accepted. [facility] will see patients 18 years of   age and older. °Monday - Tuesday (8am-5pm) °Most Wednesdays (8:30-5pm) °$30 per visit, cash only  °[facility] ° [address], [place] (336) 641-4533 Patients are seen by appointment only. Walk-ins are not accepted. [facility] will see patients 18 years of age and older. °One Wednesday Evening (Monthly: Volunteer Based).  $30 per visit, cash only  °[facility]  (919) 537-3737 for adults; Children under age 4, call Graduate Pediatric Dentistry at (919) [phone]. Children aged 4-14, please call (919) [phone] to request a pediatric application. ° Dental services are provided in all areas of dental care including fillings, crowns and bridges, complete and partial dentures, implants, gum treatment, root canals, and extractions. Preventive care is also provided. Treatment is provided to both adults and children. °Patients are selected via a lottery and there is often a waiting list. °  °[facility] [address], °[place] ° (336) 763-8833 [email or web address] °  °[facility]  [address], [place], [place] (336)723-1848, Ext. 123 Second and Fourth Thursday of each month, opens at 6:30 AM; Clinic ends at 9 AM.  Patients are seen on a first-come first-served basis, and a limited number are seen during each clinic.  ° °[facility] ° [address], [place], [place] (336) 723-7904   Eligibility Requirements °You must have lived in [place], [place], or [place] counties for at least the last three months. °  You cannot be eligible for state or federal sponsored healthcare insurance, including Veterans Administration, Medicaid, or Medicare. °  You generally cannot be eligible for healthcare insurance through your employer.  °  How to apply: °Eligibility screenings are held every Tuesday and Wednesday afternoon from 1:00 pm until 4:00 pm. You do not need an appointment for the interview!  °[facility] [address], [place], [place] 336-631-2330   °[facility]  336-342-8273   °[facility]  336-703-3100   °[facility]  336-570-6415   ° °Behavioral Health Resources in the Community: °Intensive Outpatient Programs °Organization         Address  Phone  Notes  °[facility] [address], [place], [place] 336-878-6098   °[facility] [address], [place], [place] 336-832-9800   °[facility] [address], [place], [place] ° 336-882-2125   °[facility] [address],  °[place], [place] 1-800-853-5163 or 336-641-4981   °Substance Abuse Resources °Organization         Address  Phone  Notes  °[facility]  336-882-2125   °[facility]  336-784-9470   °[facility]  336-285-9073   °[facility]  336-845-3988   °Residential & Outpatient Substance Abuse Program  1-800-659-3381   °Psychological Services °Organization         Address  Phone  Notes  °[facility]  336- 832-9600    °[facility]  336- 378-7881   °[facility] [address], [place] 1-800-853-5163 or 336-641-4981   ° °Mobile Crisis Teams °Organization         Address  Phone  Notes  °[facility], Mobile Crisis Care Unit  1-877-626-1772   °[facility]ive °[facility] ° [address] [place], [place] 336-834-9664   °Sharon DeEsch [address] °[place] 336-554-5454   ° °Self-Help/Support Groups °Organization           Address  Phone             Notes  °[facility] - variety of support groups  336- 373-1402 Call for more information  °Narcotics Anonymous (NA), [facility] [address], °[place]  2 meetings at this location  ° °Residential Treatment Programs °Organization         Address  Phone  Notes  °[facility] [address],    °[place]  1-866-801-8205   °[facility] ° [address], [place], [place] 704-293-8524   °[facility] [address], [place] 336-845-3988 Admissions: 8am-3pm M-F  °[facility] [address]    °[place], [place] 336-841-1104   °[facility] [address], [place], [place] 336-379-7146   °[facility] [address]  °[place], [place] 336-285-9073   °[facility] - Intensive Outpatient [address], [place], [place] 336-852-3033   °[facility] [address]  °[place], [place] 1-877-615-2722 or 336-784-9470   °[facility] [address] [place], [place] 336-227-7417 Accepts Medicaid  °[facility] [address]  °[place] 1-800-659-3381 Substance Abuse/Addiction Treatment  ° °[place] Behavioral Health Resources °Organization         Address  Phone  Notes  °[facility]  (888) 581-9988   °Julie Brannon, PhD [address] [place], [place]   (336) 349-5553 or (336) 951-0000    °[facility]   [address] °[place], [place] (336) 349-4454   °[facility] [address], [place], [place] (336) 342-8316 Insurance/Medicaid/sponsorship through [facility]  °[facility] [address]                                    [place], [place] (336) 342-8316 Therapy/tele-psych/case  °[facility] [address]  ° [place], [place] (336) 349-2233    °Dr. Arfeen  (336) 349-4544   °[facility] [place]  [employer] [place] [facility] 1) [address], [place] °2) [address], [place] °3)  [address], [place] (336) 349-3220 °(336) 342-7768 ° °(336) 342-8140   °[place] [facility] (336) 342-1394 or (336) 342-3537 (After Hours)    ° °   ° °

## 2013-11-07 ENCOUNTER — Encounter (HOSPITAL_BASED_OUTPATIENT_CLINIC_OR_DEPARTMENT_OTHER): Payer: Self-pay | Admitting: Emergency Medicine

## 2013-11-07 ENCOUNTER — Emergency Department (HOSPITAL_BASED_OUTPATIENT_CLINIC_OR_DEPARTMENT_OTHER)
Admission: EM | Admit: 2013-11-07 | Discharge: 2013-11-07 | Disposition: A | Discharge status: Home / Self Care | Payer: Managed Care, Other (non HMO) | Attending: Emergency Medicine | Admitting: Emergency Medicine

## 2013-11-07 DIAGNOSIS — J029 Acute pharyngitis, unspecified: Secondary | ICD-10-CM | POA: Diagnosis present

## 2013-11-07 DIAGNOSIS — J020 Streptococcal pharyngitis: Secondary | ICD-10-CM | POA: Insufficient documentation

## 2013-11-07 DIAGNOSIS — F17200 Nicotine dependence, unspecified, uncomplicated: Secondary | ICD-10-CM | POA: Diagnosis not present

## 2013-11-07 LAB — RAPID STREP SCREEN (MED CTR MEBANE ONLY): STREPTOCOCCUS, GROUP A SCREEN (DIRECT): POSITIVE — AB

## 2013-11-07 MED ORDER — PENICILLIN G BENZATHINE 1200000 UNIT/2ML IM SUSP
1.2000 10*6.[IU] | Freq: Once | INTRAMUSCULAR | Status: AC
  Administered 2013-11-07: 1.2 10*6.[IU] via INTRAMUSCULAR
  Filled 2013-11-07: qty 2

## 2013-11-07 NOTE — ED Provider Notes (Signed)
CSN: [id number]     Arrival date & time 11/07/13  1526 History   First MD Initiated Contact with Patient 11/07/13 1541     Chief Complaint  Patient presents with  . Sore Throat     (Consider location/radiation/quality/duration/timing/severity/associated sxs/prior Treatment) Patient is a 28 y.o. female presenting with pharyngitis. The history is provided by the patient. No language interpreter was used.  Sore Throat This is a new problem. The current episode started today. The problem occurs constantly. The problem has been unchanged. Associated symptoms include chills and a sore throat. Pertinent negatives include no fever. The symptoms are aggravated by swallowing. She has tried nothing for the symptoms.    History reviewed. No pertinent past medical history. Past Surgical History  Procedure Laterality Date  . Small intestine surgery     No family history on file. History  Substance Use Topics  . Smoking status: Current Every Day Smoker -- 0.20 packs/day    Types: Cigarettes  . Smokeless tobacco: Not on file     Comment: Black and Mild  . Alcohol Use: No   OB History   Grav Para Term Preterm Abortions TAB SAB Ect Mult Living                 Review of Systems  Constitutional: Positive for chills. Negative for fever.  HENT: Positive for sore throat.   Respiratory: Negative.   Cardiovascular: Negative.       Allergies  Review of patient's allergies indicates no known allergies.  Home Medications   Prior to Admission medications   Medication Sig Start Date End Date Taking? Authorizing Provider  HYDROcodone-acetaminophen (NORCO/VICODIN) 5-325 MG per tablet Take 1-2 tablets by mouth every 6 (six) hours as needed for pain. 08/10/12   Carlisle BeersJohn L Molpus, MD  ondansetron (ZOFRAN ODT) 4 MG disintegrating tablet Take 1 tablet (4 mg total) by mouth every 8 (eight) hours as needed for nausea or vomiting. 07/24/13   Gerhard Munchobert Lockwood, MD   BP 132/69  Pulse 77  Temp(Src) 99.5 F (37.5  C) (Oral)  Resp 18  Ht 5\' 7"  (1.702 m)  Wt 151 lb (68.493 kg)  BMI 23.64 kg/m2  SpO2 100%  LMP 10/27/2013 Physical Exam  Nursing note and vitals reviewed. Constitutional: She is oriented to person, place, and time. She appears well-developed and well-nourished.  HENT:  Right Ear: External ear normal.  Left Ear: External ear normal.  Mouth/Throat: Posterior oropharyngeal erythema present.  Neck: Normal range of motion. Neck supple.  Cardiovascular: Normal rate and regular rhythm.   Pulmonary/Chest: Effort normal and breath sounds normal.  Neurological: She is alert and oriented to person, place, and time.  Skin: Skin is warm and dry.    ED Course  Procedures (including critical care time) Labs Review Labs Reviewed  RAPID STREP SCREEN - Abnormal; Notable for the following:    Streptococcus, Group A Screen (Direct) POSITIVE (*)    All other components within normal limits    Imaging Review No results found.   EKG Interpretation None      MDM   Final diagnoses:  Strep pharyngitis    Pt treated with bicillin here   Teressa LowerVrinda Kevia Zaucha, NP 11/07/13 1622

## 2013-11-07 NOTE — Discharge Instructions (Signed)
Follow up as needed for continued symptoms Strep Throat Tests While most sore throats are caused by viruses, at times they are caused by a bacteria called group A Streptococci (strep throat). It is important to determine the cause because the strep bacteria is treated with antibiotic medication. There are 2 types of tests for strep throat: a rapid strep test and a throat culture. Both tests are done by wiping a swab over the back of the throat and then using chemicals to identify the type of bacteria present. The rapid strep test takes 10 to 20 minutes. If the rapid strep test is negative, a throat culture may be performed to confirm the results. With a throat culture, the swab is used to spread the bacteria on a gel plate and grow it in a lab, which may take 1 to 2 days. In some cases, the culture will detect strep bacteria not found with the rapid strep test. If the result of the rapid strep test is positive, no further testing is needed, and your caregiver will prescribe antibiotics. Not all test results are available during your visit. If your test results are not back during the visit, make an appointment with your caregiver to find out the results. Do not assume everything is normal if you have not heard from your caregiver or the medical facility. It is important for you to follow up on all of your test results. SEEK MEDICAL CARE IF:   Your symptoms are not improving within 1 to 2 days, or you are getting worse.  You have any other questions or concerns. SEEK IMMEDIATE MEDICAL CARE IF:   You have increased difficulty with swallowing.  You develop trouble breathing.  You have a fever. Document Released: 04/15/2004 Document Revised: 05/31/2011 Document Reviewed: 06/13/2013 [facility] Patient Information 2015 [place], [place]. This information is not intended to replace advice given to you by your health care provider. Make sure you discuss any questions you have with your health care  provider.

## 2013-11-07 NOTE — ED Notes (Signed)
Sore throat since this morning. Reports job sent her here. Chills at work. White patches on throat.

## 2013-11-08 NOTE — ED Provider Notes (Signed)
Medical screening examination/treatment/procedure(s) were performed by non-physician practitioner and as supervising physician I was immediately available for consultation/collaboration.   Toy CookeyMegan Docherty, MD 11/08/13 1144

## 2013-12-10 ENCOUNTER — Emergency Department (HOSPITAL_BASED_OUTPATIENT_CLINIC_OR_DEPARTMENT_OTHER)
Admission: EM | Admit: 2013-12-10 | Discharge: 2013-12-10 | Disposition: A | Discharge status: Home / Self Care | Payer: 59 | Attending: Emergency Medicine | Admitting: Emergency Medicine

## 2013-12-10 ENCOUNTER — Encounter (HOSPITAL_BASED_OUTPATIENT_CLINIC_OR_DEPARTMENT_OTHER): Payer: Self-pay | Admitting: Emergency Medicine

## 2013-12-10 DIAGNOSIS — F17200 Nicotine dependence, unspecified, uncomplicated: Secondary | ICD-10-CM | POA: Insufficient documentation

## 2013-12-10 DIAGNOSIS — Z3202 Encounter for pregnancy test, result negative: Secondary | ICD-10-CM | POA: Diagnosis not present

## 2013-12-10 DIAGNOSIS — Z79899 Other long term (current) drug therapy: Secondary | ICD-10-CM | POA: Diagnosis not present

## 2013-12-10 DIAGNOSIS — R51 Headache: Secondary | ICD-10-CM | POA: Insufficient documentation

## 2013-12-10 DIAGNOSIS — R112 Nausea with vomiting, unspecified: Secondary | ICD-10-CM | POA: Insufficient documentation

## 2013-12-10 LAB — CBC WITH DIFFERENTIAL/PLATELET
BASOS ABS: 0 10*3/uL (ref 0.0–0.1)
Basophils Relative: 0 % (ref 0–1)
EOS ABS: 0.1 10*3/uL (ref 0.0–0.7)
Eosinophils Relative: 1 % (ref 0–5)
HCT: 37 % (ref 36.0–46.0)
Hemoglobin: 12.4 g/dL (ref 12.0–15.0)
LYMPHS ABS: 2 10*3/uL (ref 0.7–4.0)
Lymphocytes Relative: 37 % (ref 12–46)
MCH: 31.8 pg (ref 26.0–34.0)
MCHC: 33.5 g/dL (ref 30.0–36.0)
MCV: 94.9 fL (ref 78.0–100.0)
MONO ABS: 0.6 10*3/uL (ref 0.1–1.0)
MONOS PCT: 10 % (ref 3–12)
Neutro Abs: 2.8 10*3/uL (ref 1.7–7.7)
Neutrophils Relative %: 52 % (ref 43–77)
PLATELETS: 210 10*3/uL (ref 150–400)
RBC: 3.9 MIL/uL (ref 3.87–5.11)
RDW: 12.6 % (ref 11.5–15.5)
WBC: 5.5 10*3/uL (ref 4.0–10.5)

## 2013-12-10 LAB — COMPREHENSIVE METABOLIC PANEL
ALBUMIN: 3.4 g/dL — AB (ref 3.5–5.2)
ALT: 45 U/L — AB (ref 0–35)
AST: 40 U/L — AB (ref 0–37)
Alkaline Phosphatase: 62 U/L (ref 39–117)
Anion gap: 11 (ref 5–15)
BUN: 8 mg/dL (ref 6–23)
CALCIUM: 9.4 mg/dL (ref 8.4–10.5)
CO2: 26 meq/L (ref 19–32)
Chloride: 101 mEq/L (ref 96–112)
Creatinine, Ser: 0.8 mg/dL (ref 0.50–1.10)
GFR calc Af Amer: >90 mL/min (ref >90)
GFR calc non Af Amer: >90 mL/min (ref >90)
Glucose, Bld: 94 mg/dL (ref 70–99)
Potassium: 4 mEq/L (ref 3.7–5.3)
SODIUM: 138 meq/L (ref 137–147)
TOTAL PROTEIN: 8.1 g/dL (ref 6.0–8.3)
Total Bilirubin: 0.3 mg/dL (ref 0.3–1.2)

## 2013-12-10 LAB — URINALYSIS, ROUTINE W REFLEX MICROSCOPIC
Bilirubin Urine: NEGATIVE
Glucose, UA: NEGATIVE mg/dL
HGB URINE DIPSTICK: NEGATIVE
Ketones, ur: NEGATIVE mg/dL
Leukocytes, UA: NEGATIVE
Nitrite: NEGATIVE
PROTEIN: NEGATIVE mg/dL
Specific Gravity, Urine: 1.008 (ref 1.005–1.030)
UROBILINOGEN UA: 0.2 mg/dL (ref 0.0–1.0)
pH: 7 (ref 5.0–8.0)

## 2013-12-10 LAB — LIPASE, BLOOD: LIPASE: 25 U/L (ref 11–59)

## 2013-12-10 LAB — PREGNANCY, URINE: Preg Test, Ur: NEGATIVE

## 2013-12-10 MED ORDER — ONDANSETRON HCL 4 MG PO TABS: 4.0000 mg | ORAL_TABLET | Freq: Four times a day (QID) | ORAL | Status: DC

## 2013-12-10 NOTE — ED Notes (Signed)
Intermittent nausea and vomiting x 2 weeks.

## 2013-12-10 NOTE — ED Provider Notes (Signed)
CSN: [id number]     Arrival date & time 12/10/13  1450 History   First MD Initiated Contact with Patient 12/10/13 1613     Chief Complaint  Patient presents with  . Emesis     (Consider location/radiation/quality/duration/timing/severity/associated sxs/prior Treatment) Patient is a 28 y.o. female presenting with vomiting. The history is provided by the patient.  Emesis Severity:  Moderate Duration:  2 weeks Timing:  Intermittent Number of daily episodes:  3 x day Quality:  Undigested food and stomach contents Able to tolerate:  Liquids Progression:  Unchanged Chronicity:  New Recent urination:  Normal Relieved by:  Nothing Worsened by:  Nothing tried Ineffective treatments:  None tried Associated symptoms: headaches   Associated symptoms: no abdominal pain, no chills, no cough, no diarrhea, no fever, no myalgias, no sore throat and no URI   Risk factors: not pregnant now and no sick contacts    Sarah Grant is a 28 y.o. female who presents to the ED with nausea and vomiting off and on for the past 2 weeks. Patient denies any symptoms at this time. She was having vomiting after she ate lunch today but symptoms went completely away.   Patient requesting pregnancy test.   History reviewed. No pertinent past medical history. Past Surgical History  Procedure Laterality Date  . Small intestine surgery     No family history on file. History  Substance Use Topics  . Smoking status: Current Every Day Smoker -- 0.20 packs/day    Types: Cigarettes  . Smokeless tobacco: Not on file     Comment: Black and Mild  . Alcohol Use: No   OB History   Grav Para Term Preterm Abortions TAB SAB Ect Mult Living                 Review of Systems  Constitutional: Negative for fever, chills and unexpected weight change.  HENT: Negative for congestion, dental problem, ear pain and sore throat.   Eyes: Negative for pain, redness and visual disturbance.  Respiratory: Negative for cough and  shortness of breath.   Cardiovascular: Negative for chest pain, palpitations and leg swelling.  Gastrointestinal: Positive for nausea and vomiting. Negative for abdominal pain, diarrhea and constipation.  Genitourinary: Negative for dysuria, vaginal bleeding, vaginal discharge and pelvic pain.  Musculoskeletal: Negative for back pain and myalgias.  Skin: Negative for rash and wound.  Neurological: Positive for headaches. Negative for dizziness, syncope and light-headedness.  Psychiatric/Behavioral: Negative for confusion. The patient is not nervous/anxious.       Allergies  Review of patient's allergies indicates no known allergies.  Home Medications   Prior to Admission medications   Medication Sig Start Date End Date Taking? Authorizing Provider  HYDROcodone-acetaminophen (NORCO/VICODIN) 5-325 MG per tablet Take 1-2 tablets by mouth every 6 (six) hours as needed for pain. 08/10/12   Karen Chafe Molpus, MD  ondansetron (ZOFRAN ODT) 4 MG disintegrating tablet Take 1 tablet (4 mg total) by mouth every 8 (eight) hours as needed for nausea or vomiting. 07/24/13   Carmin Muskrat, MD   BP 123/82  Pulse 71  Temp(Src) 98.9 F (37.2 C) (Oral)  Resp 16  Ht '5\' 6"'  (1.676 m)  Wt 151 lb (68.493 kg)  BMI 24.38 kg/m2  SpO2 100%  LMP 11/24/2013 Physical Exam  Nursing note and vitals reviewed. Constitutional: She is oriented to person, place, and time. She appears well-developed and well-nourished.  HENT:  Head: Normocephalic and atraumatic.  Eyes: Conjunctivae and EOM are normal.  Neck: Normal range of motion. Neck supple.  Cardiovascular: Normal rate, regular rhythm and normal heart sounds.   Pulmonary/Chest: Effort normal.  Abdominal: Soft. Bowel sounds are normal. There is no tenderness.  Musculoskeletal: Normal range of motion.  Neurological: She is alert and oriented to person, place, and time. No cranial nerve deficit.  Skin: Skin is warm and dry.  Psychiatric: She has a normal mood and  affect. Her behavior is normal.   Results for orders placed during the hospital encounter of 12/10/13 (from the past 24 hour(s))  PREGNANCY, URINE     Status: None   Collection Time    12/10/13  3:13 PM      Result Value Ref Range   Preg Test, Ur NEGATIVE  NEGATIVE  URINALYSIS, ROUTINE W REFLEX MICROSCOPIC     Status: None   Collection Time    12/10/13  3:13 PM      Result Value Ref Range   Color, Urine YELLOW  YELLOW   APPearance CLEAR  CLEAR   Specific Gravity, Urine 1.008  1.005 - 1.030   pH 7.0  5.0 - 8.0   Glucose, UA NEGATIVE  NEGATIVE mg/dL   Hgb urine dipstick NEGATIVE  NEGATIVE   Bilirubin Urine NEGATIVE  NEGATIVE   Ketones, ur NEGATIVE  NEGATIVE mg/dL   Protein, ur NEGATIVE  NEGATIVE mg/dL   Urobilinogen, UA 0.2  0.0 - 1.0 mg/dL   Nitrite NEGATIVE  NEGATIVE   Leukocytes, UA NEGATIVE  NEGATIVE    Results for orders placed during the hospital encounter of 12/10/13 (from the past 72 hour(s))  PREGNANCY, URINE     Status: None   Collection Time    12/10/13  3:13 PM      Result Value Ref Range   Preg Test, Ur NEGATIVE  NEGATIVE   Comment:            THE SENSITIVITY OF THIS     METHODOLOGY IS >20 mIU/mL.  URINALYSIS, ROUTINE W REFLEX MICROSCOPIC     Status: None   Collection Time    12/10/13  3:13 PM      Result Value Ref Range   Color, Urine YELLOW  YELLOW   APPearance CLEAR  CLEAR   Specific Gravity, Urine 1.008  1.005 - 1.030   pH 7.0  5.0 - 8.0   Glucose, UA NEGATIVE  NEGATIVE mg/dL   Hgb urine dipstick NEGATIVE  NEGATIVE   Bilirubin Urine NEGATIVE  NEGATIVE   Ketones, ur NEGATIVE  NEGATIVE mg/dL   Protein, ur NEGATIVE  NEGATIVE mg/dL   Urobilinogen, UA 0.2  0.0 - 1.0 mg/dL   Nitrite NEGATIVE  NEGATIVE   Leukocytes, UA NEGATIVE  NEGATIVE   Comment: MICROSCOPIC NOT DONE ON URINES WITH NEGATIVE PROTEIN, BLOOD, LEUKOCYTES, NITRITE, OR GLUCOSE <1000 mg/dL.  CBC WITH DIFFERENTIAL     Status: None   Collection Time    12/10/13  5:18 PM      Result Value Ref  Range   WBC 5.5  4.0 - 10.5 K/uL   RBC 3.90  3.87 - 5.11 MIL/uL   Hemoglobin 12.4  12.0 - 15.0 g/dL   HCT 37.0  36.0 - 46.0 %   MCV 94.9  78.0 - 100.0 fL   MCH 31.8  26.0 - 34.0 pg   MCHC 33.5  30.0 - 36.0 g/dL   RDW 12.6  11.5 - 15.5 %   Platelets 210  150 - 400 K/uL   Neutrophils Relative % 52  43 -  77 %   Lymphocytes Relative 37  12 - 46 %   Monocytes Relative 10  3 - 12 %   Eosinophils Relative 1  0 - 5 %   Basophils Relative 0  0 - 1 %   Neutro Abs 2.8  1.7 - 7.7 K/uL   Lymphs Abs 2.0  0.7 - 4.0 K/uL   Monocytes Absolute 0.6  0.1 - 1.0 K/uL   Eosinophils Absolute 0.1  0.0 - 0.7 K/uL   Basophils Absolute 0.0  0.0 - 0.1 K/uL  COMPREHENSIVE METABOLIC PANEL     Status: Abnormal   Collection Time    12/10/13  5:18 PM      Result Value Ref Range   Sodium 138  137 - 147 mEq/L   Potassium 4.0  3.7 - 5.3 mEq/L   Chloride 101  96 - 112 mEq/L   CO2 26  19 - 32 mEq/L   Glucose, Bld 94  70 - 99 mg/dL   BUN 8  6 - 23 mg/dL   Creatinine, Ser 0.80  0.50 - 1.10 mg/dL   Calcium 9.4  8.4 - 10.5 mg/dL   Total Protein 8.1  6.0 - 8.3 g/dL   Albumin 3.4 (*) 3.5 - 5.2 g/dL   AST 40 (*) 0 - 37 U/L   ALT 45 (*) 0 - 35 U/L   Alkaline Phosphatase 62  39 - 117 U/L   Total Bilirubin 0.3  0.3 - 1.2 mg/dL   GFR calc non Af Amer >90  >90 mL/min   GFR calc Af Amer >90  >90 mL/min   Comment: (NOTE)     The eGFR has been calculated using the CKD EPI equation.     This calculation has not been validated in all clinical situations.     eGFR's persistently <90 mL/min signify possible Chronic Kidney     Disease.   Anion gap 11  5 - 15  LIPASE, BLOOD     Status: None   Collection Time    12/10/13  5:18 PM      Result Value Ref Range   Lipase 25  11 - 59 U/L    ED Course  Procedures  After pregnancy test back and negative the patient states she needs to leave and is not having any problems right now.  MDM  28 y.o. female with intermittent nausea and vomiting x 2 week and concern for pregnancy.  She is stable for discharge with a negative pregnancy test and is asymptomatic at this time. She will follow up with her PCP or return if symptoms return.     [facility] Bunnie Pion, [place] 12/13/13 1113

## 2013-12-10 NOTE — Discharge Instructions (Signed)

## 2013-12-11 ENCOUNTER — Ambulatory Visit (INDEPENDENT_AMBULATORY_CARE_PROVIDER_SITE_OTHER): Payer: Managed Care, Other (non HMO)

## 2013-12-11 ENCOUNTER — Ambulatory Visit (INDEPENDENT_AMBULATORY_CARE_PROVIDER_SITE_OTHER): Payer: Managed Care, Other (non HMO) | Admitting: Family Medicine

## 2013-12-11 VITALS — BP 120/80 | HR 78 | Temp 98.7°F | Resp 16 | Ht 66.0 in | Wt 152.0 lb

## 2013-12-11 DIAGNOSIS — N760 Acute vaginitis: Secondary | ICD-10-CM

## 2013-12-11 DIAGNOSIS — R112 Nausea with vomiting, unspecified: Secondary | ICD-10-CM

## 2013-12-11 DIAGNOSIS — A499 Bacterial infection, unspecified: Secondary | ICD-10-CM

## 2013-12-11 DIAGNOSIS — B370 Candidal stomatitis: Secondary | ICD-10-CM

## 2013-12-11 DIAGNOSIS — B9689 Other specified bacterial agents as the cause of diseases classified elsewhere: Secondary | ICD-10-CM

## 2013-12-11 DIAGNOSIS — Z0000 Encounter for general adult medical examination without abnormal findings: Secondary | ICD-10-CM

## 2013-12-11 LAB — COMPREHENSIVE METABOLIC PANEL
ALBUMIN: 4 g/dL (ref 3.5–5.2)
ALK PHOS: 57 U/L (ref 39–117)
ALT: 48 U/L — ABNORMAL HIGH (ref 0–35)
AST: 41 U/L — AB (ref 0–37)
BUN: 12 mg/dL (ref 6–23)
CO2: 26 mEq/L (ref 19–32)
CREATININE: 0.77 mg/dL (ref 0.50–1.10)
Calcium: 9.6 mg/dL (ref 8.4–10.5)
Chloride: 103 mEq/L (ref 96–112)
Glucose, Bld: 91 mg/dL (ref 70–99)
Potassium: 4.3 mEq/L (ref 3.5–5.3)
Sodium: 136 mEq/L (ref 135–145)
Total Bilirubin: 0.3 mg/dL (ref 0.2–1.2)
Total Protein: 8 g/dL (ref 6.0–8.3)

## 2013-12-11 LAB — POCT UA - MICROSCOPIC ONLY
BACTERIA, U MICROSCOPIC: NEGATIVE
CASTS, UR, LPF, POC: NEGATIVE
Crystals, Ur, HPF, POC: NEGATIVE
MUCUS UA: NEGATIVE
RBC, urine, microscopic: NEGATIVE
WBC, Ur, HPF, POC: NEGATIVE
YEAST UA: NEGATIVE

## 2013-12-11 LAB — POCT WET PREP WITH KOH
KOH Prep POC: NEGATIVE
RBC WET PREP PER HPF POC: NEGATIVE
Trichomonas, UA: NEGATIVE
YEAST WET PREP PER HPF POC: NEGATIVE

## 2013-12-11 LAB — POCT URINALYSIS DIPSTICK
BILIRUBIN UA: NEGATIVE
GLUCOSE UA: NEGATIVE
Ketones, UA: NEGATIVE
Leukocytes, UA: NEGATIVE
NITRITE UA: NEGATIVE
PH UA: 7
Protein, UA: NEGATIVE
RBC UA: NEGATIVE
SPEC GRAV UA: 1.02
Urobilinogen, UA: 0.2

## 2013-12-11 LAB — POCT CBC
Granulocyte percent: 68.1 %G (ref 37–80)
HCT, POC: 39.7 % (ref 37.7–47.9)
Hemoglobin: 13 g/dL (ref 12.2–16.2)
LYMPH, POC: 2.2 (ref 0.6–3.4)
MCH: 31.8 pg — AB (ref 27–31.2)
MCHC: 32.7 g/dL (ref 31.8–35.4)
MCV: 97.1 fL — AB (ref 80–97)
MID (CBC): 0.6 (ref 0–0.9)
MPV: 8.7 fL (ref 0–99.8)
PLATELET COUNT, POC: 215 10*3/uL (ref 142–424)
POC Granulocyte: 6.1 (ref 2–6.9)
POC LYMPH %: 24.9 % (ref 10–50)
POC MID %: 7 % (ref 0–12)
RBC: 4.09 M/uL (ref 4.04–5.48)
RDW, POC: 12.9 %
WBC: 8.9 10*3/uL (ref 4.6–10.2)

## 2013-12-11 LAB — GLUCOSE, POCT (MANUAL RESULT ENTRY): POC Glucose: 100 mg/dl — AB (ref 70–99)

## 2013-12-11 LAB — POCT SEDIMENTATION RATE: POCT SED RATE: 52 mm/hr — AB (ref 0–22)

## 2013-12-11 LAB — TSH: TSH: 0.53 u[IU]/mL (ref 0.350–4.500)

## 2013-12-11 LAB — HCG, QUANTITATIVE, PREGNANCY

## 2013-12-11 LAB — IFOBT (OCCULT BLOOD): IFOBT: NEGATIVE

## 2013-12-11 MED ORDER — NYSTATIN 100000 UNIT/ML MT SUSP: 5.0000 mL | Freq: Four times a day (QID) | OROMUCOSAL | Status: AC

## 2013-12-11 MED ORDER — FLUCONAZOLE 150 MG PO TABS: 150.0000 mg | ORAL_TABLET | Freq: Once | ORAL | Status: AC

## 2013-12-11 MED ORDER — METRONIDAZOLE 500 MG PO TABS: 500.0000 mg | ORAL_TABLET | Freq: Two times a day (BID) | ORAL | Status: AC

## 2013-12-11 MED ORDER — ONDANSETRON HCL 4 MG PO TABS: 4.0000 mg | ORAL_TABLET | Freq: Four times a day (QID) | ORAL | Status: AC

## 2013-12-11 NOTE — Progress Notes (Signed)
Subjective:    Patient ID: Sarah Grant, female    DOB: 1985/08/22, 28 y.o.   MRN: [id number] Chief Complaint  Patient presents with  . Annual Exam    with Pap  . Nausea    with vomiting x 3 weeks    HPI  Has had n/v for the past 3 wks - will occur all throughout the day. No abd pain just freq recurrent intermittent nausea sometime resulting in vomiting but between these episodes she feels fine. Will occur at any time of day. No f/c. No voids, normal BM.  No fatigue, normal sleep (works 2 jobs). No abnormal vaginal discharge.  No other sxs, no change in appetite or activity. Still eating well and has gained weight. Is fasting today other than some gatorade. Had neg UPT but is not using anything for birth control and is here today with her partner.  Pap smear was a little over 2 yrs ago. Has a distant h/o abnormal pap >6 yrs - had biopsy and destruction.  Last pap done here but not in Epic.  Seen in ER for the same - nml cbc, UPT, UA, lipase, cmp did show slight decrease in albumin and mildly elevated LFTs. No h/o gallstones Does not drink any alcohol and not taking any otc medications.  Going to get flu shot at work.   Occ h/o migraines but none in past several mos.  History reviewed. No pertinent past medical history. Current Outpatient Prescriptions on File Prior to Visit  Medication Sig Dispense Refill  . ondansetron (ZOFRAN) 4 MG tablet Take 1 tablet (4 mg total) by mouth every 6 (six) hours.  12 tablet  0  . HYDROcodone-acetaminophen (NORCO/VICODIN) 5-325 MG per tablet Take 1-2 tablets by mouth every 6 (six) hours as needed for pain.  10 tablet  0   Current Facility-Administered Medications on File Prior to Visit  Medication Dose Route Frequency Provider Last Rate Last Dose  . cefTRIAXone (ROCEPHIN) injection 1 g  1 g Intramuscular Q24H Peyton Najjar, MD   1 g at 10/11/11 1305   No Known Allergies Past Surgical History  Procedure Laterality Date  . Small intestine  surgery    . Small intestine surgery      96 days old   Family History  Problem Relation Age of Onset  . Cancer Father   . Diabetes Maternal Grandmother   Unknown what type of cancer.    Review of Systems  Constitutional: Negative for fever, chills, activity change, appetite change, fatigue and unexpected weight change.  Respiratory: Negative for shortness of breath.   Cardiovascular: Negative for chest pain and leg swelling.  Gastrointestinal: Positive for nausea and vomiting. Negative for abdominal pain, diarrhea, constipation, blood in stool, abdominal distention and anal bleeding.  Genitourinary: Negative for dysuria, decreased urine volume and difficulty urinating.  Musculoskeletal: Negative for gait problem.  Skin: Negative for rash.  Neurological: Negative for dizziness, syncope, weakness, light-headedness and headaches.  Hematological: Negative for adenopathy.  Psychiatric/Behavioral: Negative for sleep disturbance.       Objective:  BP 120/80  Pulse 78  Temp(Src) 98.7 F (37.1 C) (Oral)  Resp 16  Ht  (1.676 m)  Wt 152 lb (68.947 kg)  BMI 24.55 kg/m2  SpO2 98%  LMP 11/24/2013  Physical Exam  Constitutional: She is oriented to person, place, and time. She appears well-developed and well-nourished. No distress.  HENT:  Head: Normocephalic and atraumatic.  Right Ear: External ear and ear canal normal.  A middle ear effusion is present.  Left Ear: External ear and ear canal normal. A middle ear effusion is present.  Nose: Mucosal edema present. No rhinorrhea.  Mouth/Throat: Uvula is midline and mucous membranes are normal. Posterior oropharyngeal erythema present. No oropharyngeal exudate or posterior oropharyngeal edema.  White coat on tongue  Eyes: Conjunctivae and EOM are normal. Pupils are equal, round, and reactive to light. Right eye exhibits no discharge. Left eye exhibits no discharge. No scleral icterus.  Neck: Normal range of motion. Neck supple. No  thyromegaly present.  Cardiovascular: Normal rate, regular rhythm, normal heart sounds and intact distal pulses.   Pulmonary/Chest: Effort normal and breath sounds normal. No respiratory distress.  Abdominal: Soft. Bowel sounds are increased. There is no hepatosplenomegaly. There is no tenderness. There is no rebound, no CVA tenderness, no tenderness at McBurney's point and negative Murphy's sign.  Large transverse supraumbilical scar with scar tissue  Genitourinary: Vagina normal and uterus normal. No breast swelling, tenderness, discharge or bleeding. Cervix exhibits no motion tenderness and no friability. Right adnexum displays no mass and no tenderness. Left adnexum displays no mass and no tenderness.  Musculoskeletal: She exhibits no edema.  Lymphadenopathy:    She has no cervical adenopathy.  Neurological: She is alert and oriented to person, place, and time. She has normal reflexes.  Skin: Skin is warm and dry. She is not diaphoretic. No erythema.  Psychiatric: She has a normal mood and affect. Her behavior is normal.      Results for orders placed in visit on 12/11/13  POCT CBC      Result Value Ref Range   WBC 8.9  4.6 - 10.2 K/uL   Lymph, poc 2.2  0.6 - 3.4   POC LYMPH PERCENT 24.9  10 - 50 %L   MID (cbc) 0.6  0 - 0.9   POC MID % 7.0  0 - 12 %M   POC Granulocyte 6.1  2 - 6.9   Granulocyte percent 68.1  37 - 80 %G   RBC 4.09  4.04 - 5.48 M/uL   Hemoglobin 13.0  12.2 - 16.2 g/dL   HCT, POC 16.1  09.6 - 47.9 %   MCV 97.1 (*) 80 - 97 fL   MCH, POC 31.8 (*) 27 - 31.2 pg   MCHC 32.7  31.8 - 35.4 g/dL   RDW, POC 04.5     Platelet Count, POC 215  142 - 424 K/uL   MPV 8.7  0 - 99.8 fL  GLUCOSE, POCT (MANUAL RESULT ENTRY)      Result Value Ref Range   POC Glucose 100 (*) 70 - 99 mg/dl  IFOBT (OCCULT BLOOD)      Result Value Ref Range   IFOBT Negative    POCT UA - MICROSCOPIC ONLY      Result Value Ref Range   WBC, Ur, HPF, POC neg     RBC, urine, microscopic neg      Bacteria, U Microscopic neg     Mucus, UA neg     Epithelial cells, urine per micros 0-3     Crystals, Ur, HPF, POC neg     Casts, Ur, LPF, POC neg     Yeast, UA neg    POCT URINALYSIS DIPSTICK      Result Value Ref Range   Color, UA yellow     Clarity, UA clear     Glucose, UA neg     Bilirubin, UA neg  Ketones, UA neg     Spec Grav, UA 1.020     Blood, UA neg     pH, UA 7.0     Protein, UA neg     Urobilinogen, UA 0.2     Nitrite, UA neg     Leukocytes, UA Negative    POCT WET PREP WITH KOH      Result Value Ref Range   Trichomonas, UA Negative     Clue Cells Wet Prep HPF POC 3-8     Epithelial Wet Prep HPF POC 6-12     Yeast Wet Prep HPF POC neg     Bacteria Wet Prep HPF POC 2+     RBC Wet Prep HPF POC neg     WBC Wet Prep HPF POC 7-10     KOH Prep POC Negative     [facility] reading (PRIMARY) by  Dr. Clelia Croft. Acute abdomen series: No acute abnormality, no abnormal bowel gas pattern, no sig constipation, no reason for n/v seen. Assessment & Plan:   Routine general medical examination at a health care facility - Plan: POCT CBC, POCT glucose (manual entry), IFOBT POC (occult bld, rslt in office), POCT SEDIMENTATION RATE, POCT UA - Microscopic Only, POCT urinalysis dipstick, POCT Wet Prep with KOH, Comprehensive metabolic panel, TSH, hCG, quantitative, pregnancy, Pap IG, CT/NG w/ reflex HPV when ASC-U  Non-intractable vomiting with nausea, vomiting of unspecified type - Plan: DG Abd Acute W/Chest - unsure of etiology - could be temporary/intermittent obstruction from scar tissue from newborn GI surgery, ddx includes gallstones, pregnancies - start daily ginger and B6 supp w/ prn zofran if needed but warned of constipation.  If sxs not improving/resolve w/ flagyl, then recommend recheck to cons further imaging with CT vs [place] and referral to GI.  Thrush - likely from recent strep throat treated with bicillin - rec nystatin x 1 wk  Bacterial vaginosis - flagyl - may help with bowel  sxs  Meds ordered this encounter  Medications  . metroNIDAZOLE (FLAGYL) 500 MG tablet    Sig: Take 1 tablet (500 mg total) by mouth 2 (two) times daily.    Dispense:  14 tablet    Refill:  0  . fluconazole (DIFLUCAN) 150 MG tablet    Sig: Take 1 tablet (150 mg total) by mouth once.    Dispense:  1 tablet    Refill:  0  . nystatin (MYCOSTATIN) 100000 UNIT/ML suspension    Sig: Take 5 mLs (500,000 Units total) by mouth 4 (four) times daily. Swish around mouth for as long as possible before swallowing    Dispense:  120 mL    Refill:  0  . ondansetron (ZOFRAN) 4 MG tablet    Sig: Take 1 tablet (4 mg total) by mouth every 6 (six) hours.    Dispense:  20 tablet    Refill:  0     Norberto Sorenson, MD MPH

## 2013-12-11 NOTE — Patient Instructions (Signed)
Recommend starting ginger supplement several times daily and consider augmenting with vitamin b6 as well.  Recommend low fat diet without spices or acid.  We will wait on your labs - if they are unrevealing and you are not feeling better after the course of metronidazole and yeast treatment, then we should consider getting further imaging of your stomach (like an ultrasound to check for gallstones or a CT scan) and consider whether you need to see a GI doctor.  If symptoms worsen at all, come back to clinic immediatley.  Nausea and Vomiting Nausea is a sick feeling that often comes before throwing up (vomiting). Vomiting is a reflex where stomach contents come out of your mouth. Vomiting can cause severe loss of body fluids (dehydration). Children and elderly adults can become dehydrated quickly, especially if they also have diarrhea. Nausea and vomiting are symptoms of a condition or disease. It is important to find the cause of your symptoms. CAUSES   Direct irritation of the stomach lining. This irritation can result from increased acid production (gastroesophageal reflux disease), infection, food poisoning, taking certain medicines (such as nonsteroidal anti-inflammatory drugs), alcohol use, or tobacco use.  Signals from the brain.These signals could be caused by a headache, heat exposure, an inner ear disturbance, increased pressure in the brain from injury, infection, a tumor, or a concussion, pain, emotional stimulus, or metabolic problems.  An obstruction in the gastrointestinal tract (bowel obstruction).  Illnesses such as diabetes, hepatitis, gallbladder problems, appendicitis, kidney problems, cancer, sepsis, atypical symptoms of a heart attack, or eating disorders.  Medical treatments such as chemotherapy and radiation.  Receiving medicine that makes you sleep (general anesthetic) during surgery. DIAGNOSIS Your caregiver may ask for tests to be done if the problems do not improve after  a few days. Tests may also be done if symptoms are severe or if the reason for the nausea and vomiting is not clear. Tests may include:  Urine tests.  Blood tests.  Stool tests.  Cultures (to look for evidence of infection).  X-rays or other imaging studies. Test results can help your caregiver make decisions about treatment or the need for additional tests. TREATMENT You need to stay well hydrated. Drink frequently but in small amounts.You may wish to drink water, sports drinks, clear broth, or eat frozen ice pops or gelatin dessert to help stay hydrated.When you eat, eating slowly may help prevent nausea.There are also some antinausea medicines that may help prevent nausea. HOME CARE INSTRUCTIONS   Take all medicine as directed by your caregiver.  If you do not have an appetite, do not force yourself to eat. However, you must continue to drink fluids.  If you have an appetite, eat a normal diet unless your caregiver tells you differently.  Eat a variety of complex carbohydrates (rice, wheat, potatoes, bread), lean meats, yogurt, fruits, and vegetables.  Avoid high-fat foods because they are more difficult to digest.  Drink enough water and fluids to keep your urine clear or pale yellow.  If you are dehydrated, ask your caregiver for specific rehydration instructions. Signs of dehydration may include:  Severe thirst.  Dry lips and mouth.  Dizziness.  Dark urine.  Decreasing urine frequency and amount.  Confusion.  Rapid breathing or pulse. SEEK IMMEDIATE MEDICAL CARE IF:   You have blood or brown flecks (like coffee grounds) in your vomit.  You have black or bloody stools.  You have a severe headache or stiff neck.  You are confused.  You have severe  abdominal pain.  You have chest pain or trouble breathing.  You do not urinate at least once every 8 hours.  You develop cold or clammy skin.  You continue to vomit for longer than 24 to 48 hours.  You  have a fever. MAKE SURE YOU:   Understand these instructions.  Will watch your condition.  Will get help right away if you are not doing well or get worse. Document Released: 03/08/2005 Document Revised: 05/31/2011 Document Reviewed: 08/05/2010 [facility] Patient Information 2015 [place], [place]. This information is not intended to replace advice given to you by your health care provider. Make sure you discuss any questions you have with your health care provider.  Nausea, Adult Nausea is the feeling that you have an upset stomach or have to vomit. Nausea by itself is not likely a serious concern, but it may be an early sign of more serious medical problems. As nausea gets worse, it can lead to vomiting. If vomiting develops, there is the risk of dehydration.  CAUSES   Viral infections.  Food poisoning.  Medicines.  Pregnancy.  Motion sickness.  Migraine headaches.  Emotional distress.  Severe pain from any source.  Alcohol intoxication. HOME CARE INSTRUCTIONS  Get plenty of rest.  Ask your caregiver about specific rehydration instructions.  Eat small amounts of food and sip liquids more often.  Take all medicines as told by your caregiver. SEEK MEDICAL CARE IF:  You have not improved after 2 days, or you get worse.  You have a headache. SEEK IMMEDIATE MEDICAL CARE IF:   You have a fever.  You faint.  You keep vomiting or have blood in your vomit.  You are extremely weak or dehydrated.  You have dark or bloody stools.  You have severe chest or abdominal pain. MAKE SURE YOU:  Understand these instructions.  Will watch your condition.  Will get help right away if you are not doing well or get worse. Document Released: 04/15/2004 Document Revised: 12/01/2011 Document Reviewed: 11/18/2010 [facility] Patient Information 2015 [place], [place]. This information is not intended to replace advice given to you by your health care provider. Make sure you discuss any  questions you have with your health care provider.

## 2013-12-12 LAB — PAP IG, CT-NG, RFX HPV ASCU
Chlamydia Probe Amp: NEGATIVE
GC Probe Amp: NEGATIVE

## 2013-12-13 NOTE — ED Provider Notes (Signed)
Medical screening examination/treatment/procedure(s) were performed by non-physician practitioner and as supervising physician I was immediately available for consultation/collaboration.     Chinelo Benn, MD 12/13/13 1548 

## 2014-10-14 ENCOUNTER — Emergency Department (HOSPITAL_BASED_OUTPATIENT_CLINIC_OR_DEPARTMENT_OTHER)
Admission: EM | Admit: 2014-10-14 | Discharge: 2014-10-14 | Disposition: A | Discharge status: Home / Self Care | Payer: Managed Care, Other (non HMO) | Attending: Emergency Medicine | Admitting: Emergency Medicine

## 2014-10-14 ENCOUNTER — Encounter (HOSPITAL_BASED_OUTPATIENT_CLINIC_OR_DEPARTMENT_OTHER): Payer: Self-pay | Admitting: *Deleted

## 2014-10-14 DIAGNOSIS — S01112A Laceration without foreign body of left eyelid and periocular area, initial encounter: Secondary | ICD-10-CM | POA: Insufficient documentation

## 2014-10-14 DIAGNOSIS — Y9389 Activity, other specified: Secondary | ICD-10-CM | POA: Diagnosis not present

## 2014-10-14 DIAGNOSIS — Y9289 Other specified places as the place of occurrence of the external cause: Secondary | ICD-10-CM | POA: Insufficient documentation

## 2014-10-14 DIAGNOSIS — Y288XXA Contact with other sharp object, undetermined intent, initial encounter: Secondary | ICD-10-CM | POA: Diagnosis not present

## 2014-10-14 DIAGNOSIS — Y998 Other external cause status: Secondary | ICD-10-CM | POA: Insufficient documentation

## 2014-10-14 DIAGNOSIS — Z720 Tobacco use: Secondary | ICD-10-CM | POA: Diagnosis not present

## 2014-10-14 DIAGNOSIS — Z792 Long term (current) use of antibiotics: Secondary | ICD-10-CM | POA: Insufficient documentation

## 2014-10-14 DIAGNOSIS — Z79899 Other long term (current) drug therapy: Secondary | ICD-10-CM | POA: Insufficient documentation

## 2014-10-14 NOTE — Discharge Instructions (Signed)
Please read and follow all provided instructions.  Your diagnoses today include:  1. Eyelid laceration, left, initial encounter    Tests performed today include:  Vital signs. See below for your results today.   Medications prescribed:   None  Take any prescribed medications only as directed.   Home care instructions:  Follow any educational materials and wound care instructions contained in this packet.   Keep affected area above the level of your heart when possible to minimize swelling. Wash area gently twice a day with warm soapy water. Do not apply alcohol or hydrogen peroxide. Cover the area if it draining or weeping.   Follow-up instructions:  Return instructions:  Return to the Emergency Department if you have:  Fever  Worsening pain  Worsening swelling of the wound  Pus draining from the wound  Redness of the skin that moves away from the wound, especially if it streaks away from the affected area   Any other emergent concerns  Your vital signs today were: BP 131/76 mmHg   Pulse 84   Temp(Src) 98.4 F (36.9 C) (Oral)   Resp 16   Ht  (1.651 m)   Wt 158 lb (71.668 kg)   BMI 26.29 kg/m2   SpO2 100%   LMP 09/28/2014 If your blood pressure (BP) was elevated above 135/85 this visit, please have this repeated by your doctor within one month. --------------

## 2014-10-14 NOTE — ED Notes (Signed)
D/c home- no new rx given- wound care expectations reviewed with pt who verbalizes understanding

## 2014-10-14 NOTE — ED Provider Notes (Signed)
CSN: [id number]     Arrival date & time 10/14/14  1425 History   First MD Initiated Contact with Patient 10/14/14 [phone]     Chief Complaint  Patient presents with  . Facial Laceration     (Consider location/radiation/quality/duration/timing/severity/associated sxs/prior Treatment) HPI Comments: Patient presents with very small laceration of her left upper eyelid sustained an approximately 12:30 PM today when she bent over and struck her face on the corner of her car door. Patient applied a Band-Aid prior to arrival. She did not lose consciousness. No change in vision. No other injuries reported. Tetanus is up-to-date. The onset of this condition was acute. The course is constant.   The history is provided by the patient.    History reviewed. No pertinent past medical history. Past Surgical History  Procedure Laterality Date  . Small intestine surgery    . Small intestine surgery      53 days old   Family History  Problem Relation Age of Onset  . Cancer Father   . Diabetes Maternal Grandmother    History  Substance Use Topics  . Smoking status: Current Every Day Smoker -- 0.20 packs/day    Types: Cigarettes  . Smokeless tobacco: Not on file     Comment: Black and Mild  . Alcohol Use: Yes   OB History    No data available     Review of Systems  Constitutional: Negative for fatigue.  HENT: Negative for tinnitus.   Eyes: Negative for photophobia, pain and visual disturbance.  Respiratory: Negative for shortness of breath.   Cardiovascular: Negative for chest pain.  Gastrointestinal: Negative for nausea and vomiting.  Musculoskeletal: Negative for back pain, gait problem and neck pain.  Skin: Positive for wound.  Neurological: Negative for dizziness, weakness, light-headedness, numbness and headaches.  Psychiatric/Behavioral: Negative for confusion and decreased concentration.      Allergies  Review of patient's allergies indicates no known allergies.  Home  Medications   Prior to Admission medications   Medication Sig Start Date End Date Taking? Authorizing Provider  fluconazole (DIFLUCAN) 150 MG tablet Take 1 tablet (150 mg total) by mouth once. 12/11/13   Sherren Mocha, MD  HYDROcodone-acetaminophen (NORCO/VICODIN) 5-325 MG per tablet Take 1-2 tablets by mouth every 6 (six) hours as needed for pain. 08/10/12   John Molpus, MD  metroNIDAZOLE (FLAGYL) 500 MG tablet Take 1 tablet (500 mg total) by mouth 2 (two) times daily. 12/11/13   Sherren Mocha, MD  nystatin (MYCOSTATIN) 100000 UNIT/ML suspension Take 5 mLs (500,000 Units total) by mouth 4 (four) times daily. Swish around mouth for as long as possible before swallowing 12/11/13   Sherren Mocha, MD  ondansetron (ZOFRAN) 4 MG tablet Take 1 tablet (4 mg total) by mouth every 6 (six) hours. 12/11/13   Sherren Mocha, MD   BP 131/76 mmHg  Pulse 84  Temp(Src) 98.4 F (36.9 C) (Oral)  Resp 16  Ht  (1.651 m)  Wt 158 lb (71.668 kg)  BMI 26.29 kg/m2  SpO2 100%  LMP 09/28/2014   Physical Exam  Constitutional: She appears well-developed and well-nourished.  HENT:  Head: Normocephalic and atraumatic.  Patient with 5mm L upper eyelid laceration, minimally gaping, clean, linear, hemostatic. No FB. No underlying structures visualized.   Eyes: Conjunctivae are normal.  Neck: Normal range of motion. Neck supple.  Pulmonary/Chest: No respiratory distress.  Neurological: She is alert.  Skin: Skin is warm and dry.  Psychiatric: She has a normal mood  and affect.  Nursing note and vitals reviewed.   ED Course  Procedures (including critical care time) Labs Review Labs Reviewed - No data to display  Imaging Review No results found.   EKG Interpretation None       3:10 PM Patient seen and examined.  Vital signs reviewed and are as follows: BP 131/76 mmHg  Pulse 84  Temp(Src) 98.4 F (36.9 C) (Oral)  Resp 16  Ht 5\' 5"  (1.651 m)  Wt 158 lb (71.668 kg)  BMI 26.29 kg/m2  SpO2 100%  LMP  09/28/2014  LACERATION REPAIR Performed by: Carolee Rota Authorized by: Carolee Rota Consent: Verbal consent obtained. Risks and benefits: risks, benefits and alternatives were discussed Consent given by: patient Patient identity confirmed: provided demographic data Prepped and Draped in normal sterile fashion Wound explored  Laceration Location: L upper eyelid  Laceration Length: 0.5cm  No Foreign Bodies seen or palpated  Anesthesia: none  Amount of cleaning: skin scrub with dermal cleanser  Skin closure: dermabond  Patient tolerance: Patient tolerated the procedure well with no immediate complications.  3:31 PM Pt urged to return with worsening pain, worsening swelling, expanding area of redness or streaking, fever, or any other concerns. Pt verbalizes understanding and agrees with plan.   MDM   Final diagnoses:  Eyelid laceration, left, initial encounter   Minor upper eyelid laceration repair with Dermabond. No injury to globe. No significant head injury suspected.    Renne Crigler, PA-C 10/14/14 1531  Blake Divine, MD 10/14/14 [phone]

## 2014-10-14 NOTE — ED Notes (Signed)
Pt hit her face on her car door, lacerating her left eyelid.

## 2015-06-03 ENCOUNTER — Encounter (HOSPITAL_BASED_OUTPATIENT_CLINIC_OR_DEPARTMENT_OTHER): Payer: Self-pay | Admitting: Emergency Medicine

## 2015-06-03 ENCOUNTER — Emergency Department (HOSPITAL_BASED_OUTPATIENT_CLINIC_OR_DEPARTMENT_OTHER)
Admission: EM | Admit: 2015-06-03 | Discharge: 2015-06-03 | Disposition: A | Discharge status: Home / Self Care | Payer: Managed Care, Other (non HMO) | Attending: Emergency Medicine | Admitting: Emergency Medicine

## 2015-06-03 DIAGNOSIS — M5432 Sciatica, left side: Secondary | ICD-10-CM | POA: Insufficient documentation

## 2015-06-03 DIAGNOSIS — Z792 Long term (current) use of antibiotics: Secondary | ICD-10-CM | POA: Diagnosis not present

## 2015-06-03 DIAGNOSIS — Z79899 Other long term (current) drug therapy: Secondary | ICD-10-CM | POA: Diagnosis not present

## 2015-06-03 DIAGNOSIS — F17210 Nicotine dependence, cigarettes, uncomplicated: Secondary | ICD-10-CM | POA: Insufficient documentation

## 2015-06-03 DIAGNOSIS — M545 Low back pain: Secondary | ICD-10-CM | POA: Diagnosis present

## 2015-06-03 MED ORDER — HYDROCODONE-ACETAMINOPHEN 5-325 MG PO TABS: 1.0000 | ORAL_TABLET | Freq: Four times a day (QID) | ORAL | Status: AC | PRN

## 2015-06-03 MED ORDER — PREDNISONE 50 MG PO TABS: 50.0000 mg | ORAL_TABLET | Freq: Every day | ORAL | Status: AC

## 2015-06-03 MED ORDER — METAXALONE 800 MG PO TABS: 800.0000 mg | ORAL_TABLET | Freq: Three times a day (TID) | ORAL | Status: AC

## 2015-06-03 NOTE — Discharge Instructions (Signed)
Return here as needed.  Follow-up with Dr. provided °

## 2015-06-03 NOTE — ED Notes (Signed)
Patient states that she is having pain to her left hip that radiated down into her left lower extremity. The patient reports that it worsened last night.

## 2015-06-03 NOTE — ED Provider Notes (Signed)
CSN: [id number]     Arrival date & time 06/03/15  1843 History   First MD Initiated Contact with Patient 06/03/15 1859     Chief Complaint  Patient presents with  . Back Pain     (Consider location/radiation/quality/duration/timing/severity/associated sxs/prior Treatment) HPI Patient presents to the emergency department with left lower back pain that radiates into her left leg since Friday.  The patient states that she noticed her back pain while at work.  She states coworker had tightened an ankle brace on her left ankle that she wears for support and about an hour later, her back was bothering her.  She states the pain radiates into her left leg.  Patient states that movement and palpation make the pain worse.  She states she took some over-the-counter medications without significant relief of her symptoms.  Patient denies numbness, weakness, dizziness, headache, blurred vision, fever, dysuria, incontinence, abdominal pain, nausea, vomiting, diarrhea, or syncope History reviewed. No pertinent past medical history. Past Surgical History  Procedure Laterality Date  . Small intestine surgery    . Small intestine surgery      38 days old   Family History  Problem Relation Age of Onset  . Cancer Father   . Diabetes Maternal Grandmother    Social History  Substance Use Topics  . Smoking status: Current Every Day Smoker -- 0.20 packs/day    Types: Cigarettes  . Smokeless tobacco: None     Comment: Black and Mild  . Alcohol Use: Yes   OB History    No data available     Review of Systems  All other systems negative except as documented in the HPI. All pertinent positives and negatives as reviewed in the HPI.  Allergies  Review of patient's allergies indicates no known allergies.  Home Medications   Prior to Admission medications   Medication Sig Start Date End Date Taking? Authorizing Provider  fluconazole (DIFLUCAN) 150 MG tablet Take 1 tablet (150 mg total) by mouth once.  12/11/13   Sherren Mocha, MD  HYDROcodone-acetaminophen (NORCO/VICODIN) 5-325 MG per tablet Take 1-2 tablets by mouth every 6 (six) hours as needed for pain. 08/10/12   John Molpus, MD  metroNIDAZOLE (FLAGYL) 500 MG tablet Take 1 tablet (500 mg total) by mouth 2 (two) times daily. 12/11/13   Sherren Mocha, MD  nystatin (MYCOSTATIN) 100000 UNIT/ML suspension Take 5 mLs (500,000 Units total) by mouth 4 (four) times daily. Swish around mouth for as long as possible before swallowing 12/11/13   Sherren Mocha, MD  ondansetron (ZOFRAN) 4 MG tablet Take 1 tablet (4 mg total) by mouth every 6 (six) hours. 12/11/13   Sherren Mocha, MD   BP 155/97 mmHg  Pulse 91  Temp(Src) 98.7 F (37.1 C) (Oral)  Resp 18  Ht  (1.702 m)  Wt 71.668 kg  BMI 24.74 kg/m2  SpO2 100%  LMP 05/27/2015 Physical Exam  Constitutional: She is oriented to person, place, and time. She appears well-developed and well-nourished. No distress.  HENT:  Head: Normocephalic and atraumatic.  Mouth/Throat: Oropharynx is clear and moist.  Eyes: Pupils are equal, round, and reactive to light.  Neck: Normal range of motion. Neck supple.  Cardiovascular: Normal rate, regular rhythm and normal heart sounds.  Exam reveals no gallop and no friction rub.   No murmur heard. Pulmonary/Chest: Effort normal and breath sounds normal. No respiratory distress. She has no wheezes.  Musculoskeletal:       Lumbar back: She exhibits  tenderness and pain. She exhibits normal range of motion, no bony tenderness, no swelling, no edema, no deformity, no spasm and normal pulse.       Back:  Neurological: She is alert and oriented to person, place, and time. She exhibits normal muscle tone. Coordination normal.  Skin: Skin is warm and dry. No rash noted. No erythema.  Psychiatric: She has a normal mood and affect. Her behavior is normal.  Nursing note and vitals reviewed.   ED Course  Procedures (including critical care time) Labs Review Labs Reviewed - No data to  display  Imaging Review No results found. I have personally reviewed and evaluated these images and lab results as part of my medical decision-making. Patient be treated for sciatica.  Told to return here as needed.  We will give her follow-up if her symptoms get worse.  Told to follow up with her primary care Dr. told use ice and heat on her lower back    Charlestine NightChristopher Ayiana Winslett, PA-C 06/03/15 2015  Rolland PorterMark James, MD 06/14/15 [phone]

## 2015-09-28 IMAGING — CR DG ABDOMEN ACUTE W/ 1V CHEST
3 series · 3 of 3 positions shown · non-contrast
Comparison: None.

CLINICAL DATA: Nausea and vomiting

EXAM:
ACUTE ABDOMEN SERIES (ABDOMEN 2 VIEW & CHEST 1 VIEW)

[PA]
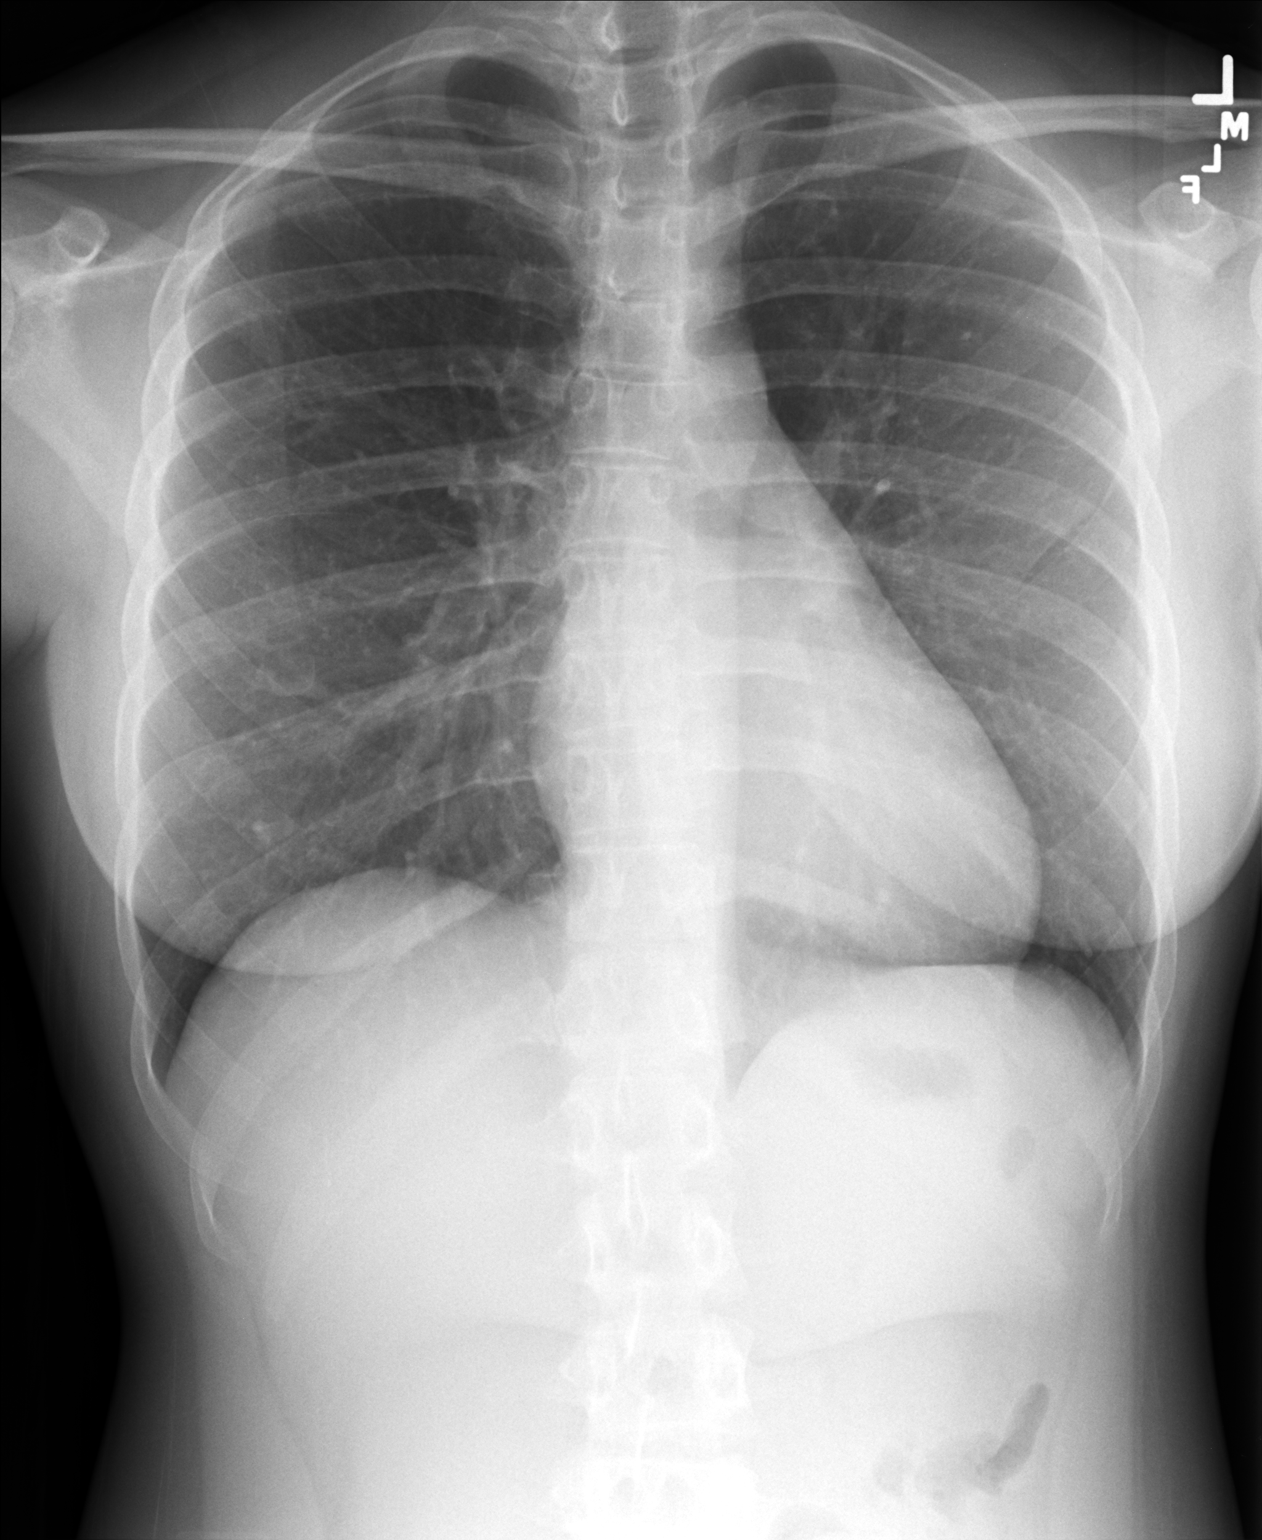

[AP (1 of 2)]
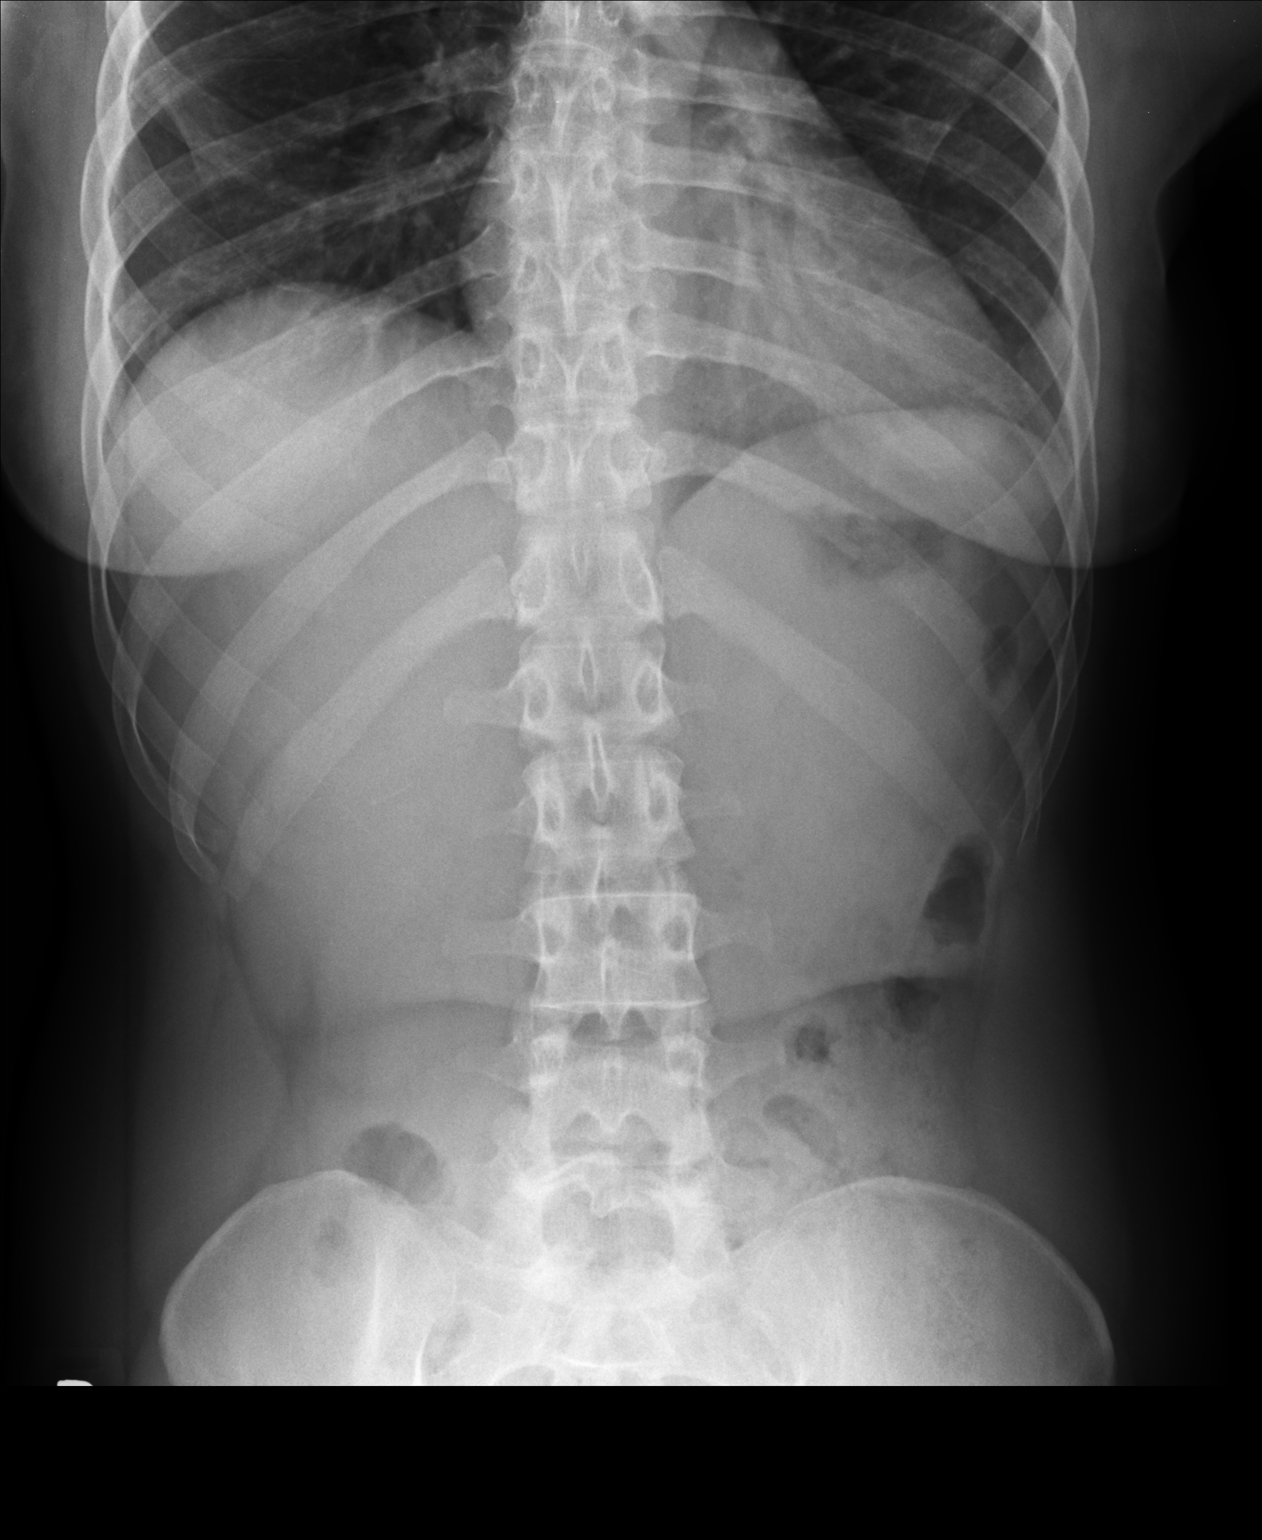

[AP (2 of 2)]
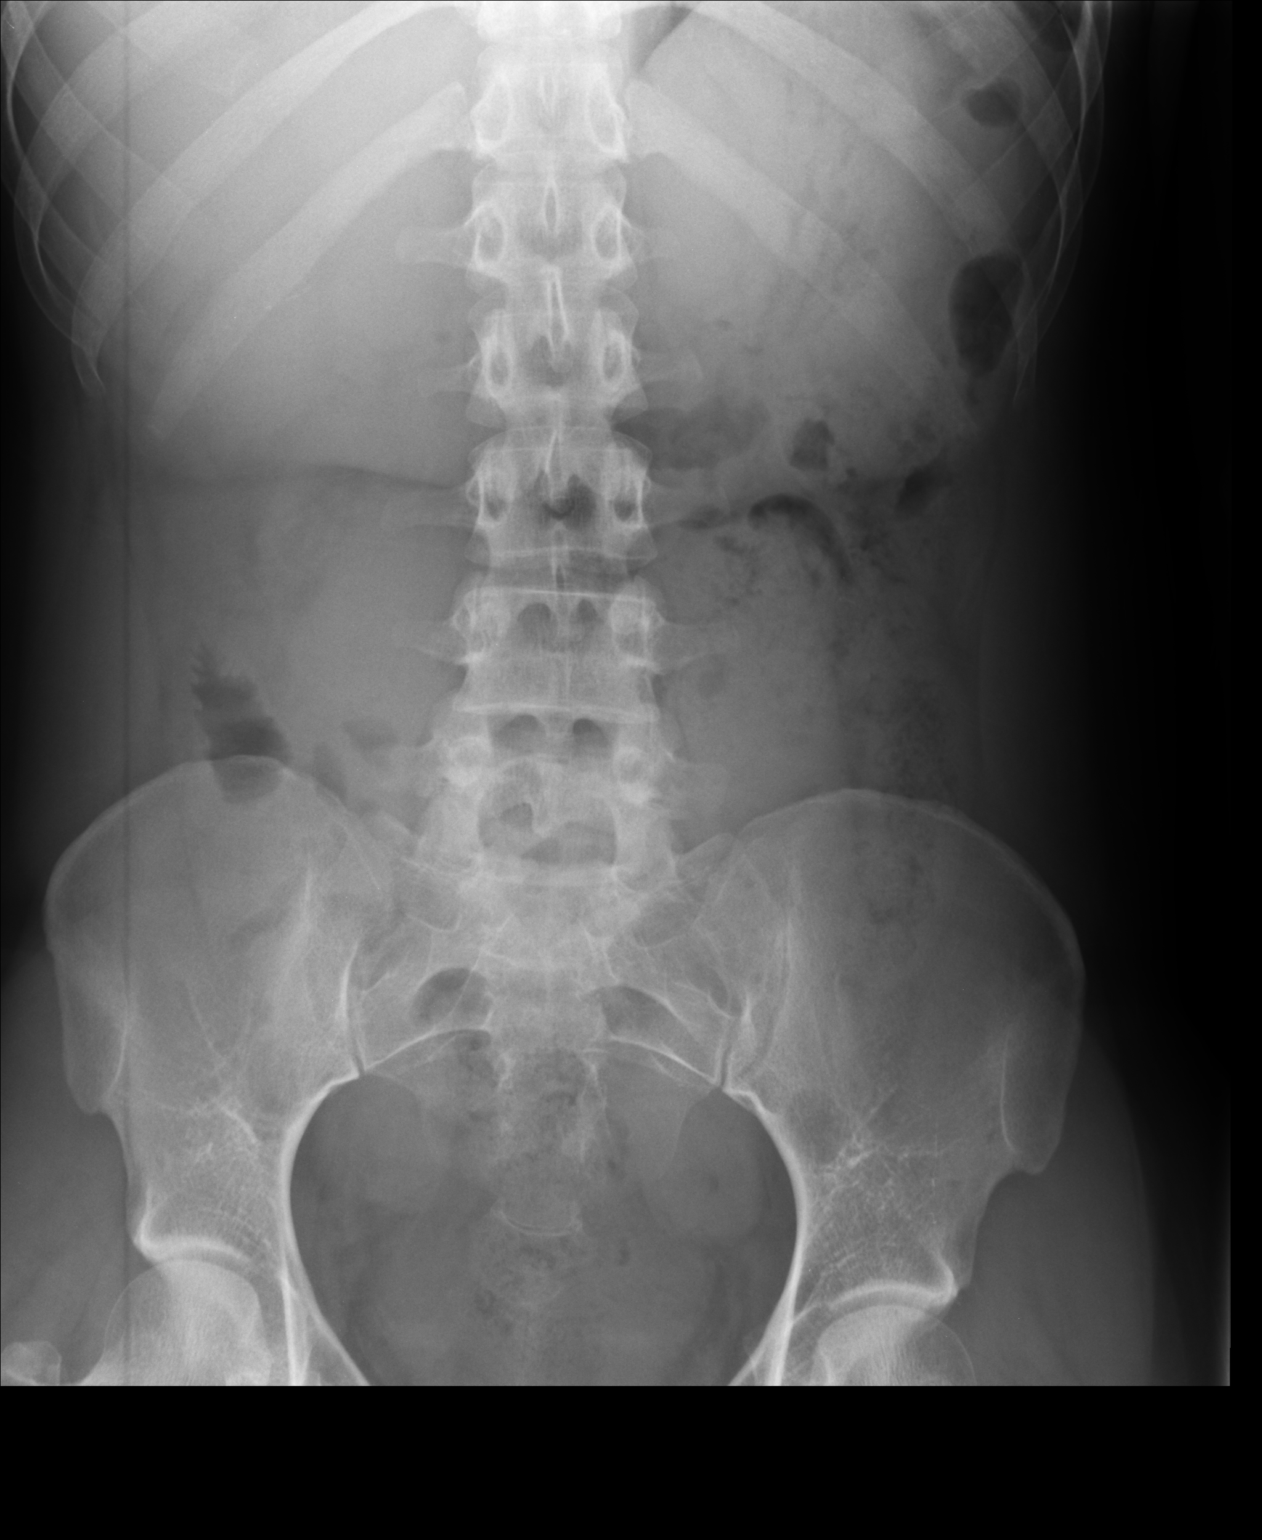

[3 of 3 positions shown; findings below may reference images not displayed]

FINDINGS: PA chest: Lungs are clear. Heart size and pulmonary vascularity are
normal. No adenopathy.

Supine and upright abdomen: There is moderate stool throughout the
colon. The bowel gas pattern is unremarkable. No obstruction or free
air. No abnormal calcifications.
IMPRESSION: Bowel gas pattern unremarkable. Moderate stool in colon. Lungs
clear.

## 2015-10-06 ENCOUNTER — Telehealth: Payer: Self-pay

## 2015-10-06 NOTE — Telephone Encounter (Signed)
Called patient for pre visit information. Left message for call back.

## 2015-10-07 ENCOUNTER — Encounter: Payer: Managed Care, Other (non HMO) | Admitting: Medical

## 2015-10-07 DIAGNOSIS — Z0289 Encounter for other administrative examinations: Secondary | ICD-10-CM

## 2015-10-07 NOTE — Progress Notes (Signed)
This encounter was created in error - please disregard.

## 2023-06-25 ENCOUNTER — Emergency Department (HOSPITAL_BASED_OUTPATIENT_CLINIC_OR_DEPARTMENT_OTHER)
Admission: EM | Admit: 2023-06-25 | Discharge: 2023-06-25 | Disposition: A | Discharge status: Home / Self Care | Attending: Emergency Medicine | Admitting: Emergency Medicine

## 2023-06-25 ENCOUNTER — Other Ambulatory Visit: Payer: Self-pay

## 2023-06-25 ENCOUNTER — Encounter (HOSPITAL_BASED_OUTPATIENT_CLINIC_OR_DEPARTMENT_OTHER): Payer: Self-pay | Admitting: Emergency Medicine

## 2023-06-25 DIAGNOSIS — W540XXA Bitten by dog, initial encounter: Secondary | ICD-10-CM | POA: Diagnosis not present

## 2023-06-25 DIAGNOSIS — S70311A Abrasion, right thigh, initial encounter: Secondary | ICD-10-CM | POA: Diagnosis not present

## 2023-06-25 DIAGNOSIS — S71112A Laceration without foreign body, left thigh, initial encounter: Secondary | ICD-10-CM | POA: Insufficient documentation

## 2023-06-25 DIAGNOSIS — S70922A Unspecified superficial injury of left thigh, initial encounter: Secondary | ICD-10-CM | POA: Diagnosis present

## 2023-06-25 HISTORY — DX: Essential (primary) hypertension: I10

## 2023-06-25 HISTORY — DX: Pure hypercholesterolemia, unspecified: E78.00

## 2023-06-25 MED ORDER — BACITRACIN ZINC 500 UNIT/GM EX OINT: TOPICAL_OINTMENT | Freq: Two times a day (BID) | CUTANEOUS | Status: DC

## 2023-06-25 MED ORDER — BACITRACIN ZINC 500 UNIT/GM EX OINT
TOPICAL_OINTMENT | CUTANEOUS | Status: AC
  Administered 2023-06-25: 31.5 via TOPICAL
  Filled 2023-06-25: qty 28.35

## 2023-06-25 MED ORDER — AMOXICILLIN-POT CLAVULANATE 875-125 MG PO TABS
1.0000 | ORAL_TABLET | Freq: Once | ORAL | Status: AC
  Administered 2023-06-25: 1 via ORAL
  Filled 2023-06-25: qty 1

## 2023-06-25 MED ORDER — AMOXICILLIN-POT CLAVULANATE 875-125 MG PO TABS: 1.0000 | ORAL_TABLET | Freq: Two times a day (BID) | ORAL | 0 refills | Status: AC

## 2023-06-25 MED ORDER — LIDOCAINE-EPINEPHRINE-TETRACAINE (LET) TOPICAL GEL
6.0000 mL | Freq: Once | TOPICAL | Status: AC
  Administered 2023-06-25: 6 mL via TOPICAL
  Filled 2023-06-25: qty 6

## 2023-06-25 NOTE — ED Provider Notes (Signed)
 [place] [facility] Provider Note   CSN: [id number] Arrival date & time: 06/25/23  0308     History  Chief Complaint  Patient presents with   Animal Bite    Sarah Grant is a 38 y.o. female.  The history is provided by the patient.  Animal Bite Sarah Grant is a 38 y.o. female who presents to the Emergency Department complaining of animal bite.  She presents to the emergency department for evaluation after getting bitten on the left buttock and right posterior thigh by her friend's dog.  She is unsure with the previous.  This occurred just prior to ED arrival.  She complains of some local pain.  Dog is up-to-date on its vaccines.  She has a history of hypertension.  Denies any chance of pregnancy.     Home Medications Prior to Admission medications   Medication Sig Start Date End Date Taking? Authorizing Provider  amoxicillin-clavulanate (AUGMENTIN) 875-125 MG tablet Take 1 tablet by mouth every 12 (twelve) hours. 06/25/23  Yes Tilden Fossa, MD  fluconazole (DIFLUCAN) 150 MG tablet Take 1 tablet (150 mg total) by mouth once. 12/11/13   Sherren Mocha, MD  HYDROcodone-acetaminophen (NORCO/VICODIN) 5-325 MG tablet Take 1 tablet by mouth every 6 (six) hours as needed for moderate pain. 06/03/15   Lawyer, Cristal Deer, PA-C  metaxalone (SKELAXIN) 800 MG tablet Take 1 tablet (800 mg total) by mouth 3 (three) times daily. 06/03/15   Lawyer, Cristal Deer, PA-C  metroNIDAZOLE (FLAGYL) 500 MG tablet Take 1 tablet (500 mg total) by mouth 2 (two) times daily. 12/11/13   Sherren Mocha, MD  nystatin (MYCOSTATIN) 100000 UNIT/ML suspension Take 5 mLs (500,000 Units total) by mouth 4 (four) times daily. Swish around mouth for as long as possible before swallowing 12/11/13   Sherren Mocha, MD  ondansetron (ZOFRAN) 4 MG tablet Take 1 tablet (4 mg total) by mouth every 6 (six) hours. 12/11/13   Sherren Mocha, MD  predniSONE (DELTASONE) 50 MG tablet Take 1 tablet (50 mg total)  by mouth daily. 06/03/15   Charlestine Night, PA-C      Allergies    Patient has no known allergies.    Review of Systems   Review of Systems  All other systems reviewed and are negative.   Physical Exam Updated Vital Signs BP (!) 144/101 (BP Location: Left Arm)   Pulse (!) 112   Temp 98.5 F (36.9 C) (Oral)   Resp 19   Ht 5\' 6"  (1.676 m)   Wt 80.3 kg   LMP 06/18/2023   SpO2 100%   BMI 28.57 kg/m  Physical Exam Vitals and nursing note reviewed.  Constitutional:      Appearance: She is well-developed.  HENT:     Head: Normocephalic and atraumatic.  Cardiovascular:     Rate and Rhythm: Normal rate and regular rhythm.  Pulmonary:     Effort: Pulmonary effort is normal. No respiratory distress.  Musculoskeletal:     Comments: There is an irregular laceration 2 cm and small punctate abrasions to the left buttocks.  There are multiple abrasions and puncture wounds to the right posterior upper thigh  Skin:    General: Skin is warm and dry.  Neurological:     Mental Status: She is alert and oriented to person, place, and time.  Psychiatric:        Behavior: Behavior normal.     ED Results / Procedures / Treatments   Labs (  all labs ordered are listed, but only abnormal results are displayed) Labs Reviewed - No data to display  EKG None  Radiology No results found.  Procedures .Laceration Repair  Date/Time: 06/25/2023 4:23 AM  Performed by: Tilden Fossa, MD Authorized by: Tilden Fossa, MD   Consent:    Consent obtained:  Verbal   Risks discussed:  Infection, pain, poor cosmetic result, poor wound healing and need for additional repair Universal protocol:    Patient identity confirmed:  Verbally with patient Anesthesia:    Anesthesia method:  Topical application   Topical anesthetic:  LET Laceration details:    Location: Left buttocks.   Length (cm):  2 Exploration:    Hemostasis achieved with:  Direct pressure   Wound exploration: wound explored  through full range of motion   Treatment:    Area cleansed with:  Chlorhexidine   Amount of cleaning:  Standard   Irrigation solution:  Sterile saline   Debridement:  None Skin repair:    Repair method:  Staples   Number of staples:  1 Approximation:    Approximation:  Loose Repair type:    Repair type:  Simple Post-procedure details:    Dressing:  Antibiotic ointment and adhesive bandage   Procedure completion:  Tolerated     Medications Ordered in ED Medications  bacitracin ointment (has no administration in time range)  lidocaine-EPINEPHrine-tetracaine (LET) topical gel (6 mLs Topical Given 06/25/23 0343)  amoxicillin-clavulanate (AUGMENTIN) 875-125 MG per tablet 1 tablet (1 tablet Oral Given 06/25/23 0349)    ED Course/ Medical Decision Making/ A&P                                 Medical Decision Making Risk OTC drugs. Prescription drug management.   Patient here for evaluation following dog bite to the buttocks and thigh.  She does have some puncture wounds, abrasions.  There is a larger wound to the left buttocks that is gaping, approximately 2 cm long.  Wounds were anesthetized with topical let.  Wounds were scrubbed and cleansed with chlorhexidine, saline.  The gaping wound was approximated loosely with 1 staple.  Discussed with patient wound care as well as return precautions for evidence of infection.  No evidence of foreign body at this time.  Dog is vaccinated.  Patient will not need rabies prophylaxis.  Will treat empirically with antibiotics.        Final Clinical Impression(s) / ED Diagnoses Final diagnoses:  Dog bite, initial encounter    Rx / DC Orders ED Discharge Orders          Ordered    amoxicillin-clavulanate (AUGMENTIN) 875-125 MG tablet  Every 12 hours        06/25/23 0422              Tilden Fossa, MD 06/25/23 0425

## 2023-06-25 NOTE — Discharge Instructions (Addendum)
 Your staple will need to come out in 7 to 10 days.

## 2023-06-25 NOTE — ED Triage Notes (Signed)
 Friend dog bit pt on left buttock and right thigh. Per friend dog should be up to date and has new appt coming up.

## 2023-06-29 ENCOUNTER — Emergency Department (HOSPITAL_BASED_OUTPATIENT_CLINIC_OR_DEPARTMENT_OTHER)

## 2023-06-29 ENCOUNTER — Encounter (HOSPITAL_BASED_OUTPATIENT_CLINIC_OR_DEPARTMENT_OTHER): Payer: Self-pay | Admitting: Emergency Medicine

## 2023-06-29 ENCOUNTER — Other Ambulatory Visit: Payer: Self-pay

## 2023-06-29 ENCOUNTER — Emergency Department (HOSPITAL_BASED_OUTPATIENT_CLINIC_OR_DEPARTMENT_OTHER)
Admission: EM | Admit: 2023-06-29 | Discharge: 2023-06-29 | Disposition: A | Discharge status: Home / Self Care | Attending: Emergency Medicine | Admitting: Emergency Medicine

## 2023-06-29 DIAGNOSIS — Z5189 Encounter for other specified aftercare: Secondary | ICD-10-CM

## 2023-06-29 DIAGNOSIS — R0789 Other chest pain: Secondary | ICD-10-CM | POA: Diagnosis present

## 2023-06-29 DIAGNOSIS — Z4800 Encounter for change or removal of nonsurgical wound dressing: Secondary | ICD-10-CM | POA: Diagnosis not present

## 2023-06-29 DIAGNOSIS — R030 Elevated blood-pressure reading, without diagnosis of hypertension: Secondary | ICD-10-CM | POA: Diagnosis not present

## 2023-06-29 DIAGNOSIS — R0781 Pleurodynia: Secondary | ICD-10-CM

## 2023-06-29 NOTE — ED Provider Notes (Signed)
 [place] [facility] Provider Note   CSN: [id number] Arrival date & time: 06/29/23  0809     History  Chief Complaint  Patient presents with   Animal Bite    Sarah Grant is a 38 y.o. female.  She was bit by dog about 5 days ago.  She was seen here that night and her wound was cleaned and she had a single staple placed in her left buttock.  Since then she has been having pain around her left buttock hip area and also pain in her left lateral chest that she thinks she injured her rib.  No fevers no significant drainage from her wounds.  No shortness of breath.  The history is provided by the patient.  Wound Check This is a new problem. The current episode started more than 2 days ago. The problem has been gradually improving. Associated symptoms include chest pain. Pertinent negatives include no abdominal pain, no headaches and no shortness of breath. The symptoms are aggravated by bending and twisting. Nothing relieves the symptoms. She has tried rest for the symptoms. The treatment provided mild relief.       Home Medications Prior to Admission medications   Medication Sig Start Date End Date Taking? Authorizing Provider  amoxicillin-clavulanate (AUGMENTIN) 875-125 MG tablet Take 1 tablet by mouth every 12 (twelve) hours. 06/25/23   Tilden Fossa, MD  fluconazole (DIFLUCAN) 150 MG tablet Take 1 tablet (150 mg total) by mouth once. 12/11/13   Sherren Mocha, MD  HYDROcodone-acetaminophen (NORCO/VICODIN) 5-325 MG tablet Take 1 tablet by mouth every 6 (six) hours as needed for moderate pain. 06/03/15   Lawyer, Cristal Deer, PA-C  metaxalone (SKELAXIN) 800 MG tablet Take 1 tablet (800 mg total) by mouth 3 (three) times daily. 06/03/15   Lawyer, Cristal Deer, PA-C  metroNIDAZOLE (FLAGYL) 500 MG tablet Take 1 tablet (500 mg total) by mouth 2 (two) times daily. 12/11/13   Sherren Mocha, MD  nystatin (MYCOSTATIN) 100000 UNIT/ML suspension Take 5 mLs (500,000 Units  total) by mouth 4 (four) times daily. Swish around mouth for as long as possible before swallowing 12/11/13   Sherren Mocha, MD  ondansetron (ZOFRAN) 4 MG tablet Take 1 tablet (4 mg total) by mouth every 6 (six) hours. 12/11/13   Sherren Mocha, MD  predniSONE (DELTASONE) 50 MG tablet Take 1 tablet (50 mg total) by mouth daily. 06/03/15   Charlestine Night, PA-C      Allergies    Patient has no known allergies.    Review of Systems   Review of Systems  Respiratory:  Negative for shortness of breath.   Cardiovascular:  Positive for chest pain.  Gastrointestinal:  Negative for abdominal pain.  Neurological:  Negative for headaches.    Physical Exam Updated Vital Signs BP (!) 165/115   Pulse 73   Temp 98.4 F (36.9 C) (Oral)   Resp 18   LMP 06/18/2023   SpO2 99%  Physical Exam Constitutional:      Appearance: Normal appearance. She is well-developed.  HENT:     Head: Normocephalic and atraumatic.  Eyes:     Conjunctiva/sclera: Conjunctivae normal.  Cardiovascular:     Rate and Rhythm: Normal rate and regular rhythm.     Pulses: Normal pulses.  Pulmonary:     Effort: Pulmonary effort is normal.     Breath sounds: Normal breath sounds.  Chest:     Chest wall: Tenderness (left lower lateral rib, no crepitus) present.  Musculoskeletal:        General: Tenderness and signs of injury present.     Cervical back: Neck supple.     Comments: She has 2 wounds on her buttock left.  They are both approximated without surrounding erythema or drainage.  No fluctuance.  A single staple was intact and removed.  Skin:    General: Skin is warm and dry.  Neurological:     General: No focal deficit present.     Mental Status: She is alert.     GCS: GCS eye subscore is 4. GCS verbal subscore is 5. GCS motor subscore is 6.     Gait: Gait normal.     ED Results / Procedures / Treatments   Labs (all labs ordered are listed, but only abnormal results are displayed) Labs Reviewed - No data to  display  EKG None  Radiology DG Ribs Unilateral W/Chest Left Result Date: 06/29/2023 CLINICAL DATA:  Left rib pain after fall. EXAM: LEFT RIBS AND CHEST - 3+ VIEW COMPARISON:  December 11, 2013. FINDINGS: No fracture or other bone lesions are seen involving the ribs. There is no evidence of pneumothorax or pleural effusion. Both lungs are clear. Heart size and mediastinal contours are within normal limits. IMPRESSION: Negative. Electronically Signed   By: Lupita Raider M.D.   On: 06/29/2023 10:34    Procedures Procedures    Medications Ordered in ED Medications - No data to display  ED Course/ Medical Decision Making/ A&P Clinical Course as of 06/29/23 1711  Wed Jun 29, 2023  0835 Single staple was removed.  No wound dehiscence. [MB]  1039 Reviewed results of workup with patient and she is comfortable plan for discharge with symptomatic treatment.  Return instructions discussed [MB]    Clinical Course User Index [MB] Terrilee Files, MD                                 Medical Decision Making Amount and/or Complexity of Data Reviewed Radiology: ordered.   This patient complains of pain in her left lateral ribs and wound check; this involves an extensive number of treatment Options and is a complaint that carries with it a high risk of complications and morbidity. The differential includes contusion, fracture, pneumothorax, cellulitis  I ordered imaging studies which included left chest and rib series and I independently    visualized and interpreted imaging which showed no acute findings Previous records obtained and reviewed in epic including recent ED visit Social determinants considered, tobacco use Critical Interventions: None  After the interventions stated above, I reevaluated the patient and found patient to be well-appearing in no distress satting normal Admission and further testing considered, no indications for admission or further workup at this time.   Patient instructed to continue antibiotics.  NSAIDs for pain.  Return instructions discussed.  She was also counseled on her elevated blood pressure and recommended close follow-up with PCP regarding this.         Final Clinical Impression(s) / ED Diagnoses Final diagnoses:  Visit for wound check  Rib pain on left side  Elevated blood pressure reading    Rx / DC Orders ED Discharge Orders     None         Terrilee Files, MD 06/29/23 [phone]

## 2023-06-29 NOTE — Discharge Instructions (Addendum)
 You were seen in the emergency department for left-sided rib pain and wound check, recent dog bite.  Your x-rays did not show any obvious rib fracture.  Pain is usually muscular and should respond to ibuprofen.  Continue soap and water to your wounds and watch for signs of infection.  Your blood pressure was also elevated here should follow-up with your primary care doctor for recheck.

## 2023-06-29 NOTE — ED Triage Notes (Signed)
 Pt reports being bit by dog Friday night. Fell on left side during attack and now having 8/10 pain in left rib and hip.  Currently has staples from Fridays dog bites
